# Patient Record
Sex: Male | Born: 1945 | Race: White | Hispanic: No | Marital: Married | Smoking: Former smoker
Health system: Southern US, Community
[De-identification: ages and names within clinical notes are randomized; demographics above are authoritative.]

## PROBLEM LIST (undated history)

## (undated) DIAGNOSIS — E119 Type 2 diabetes mellitus without complications: Secondary | ICD-10-CM

## (undated) DIAGNOSIS — R000 Tachycardia, unspecified: Secondary | ICD-10-CM

## (undated) DIAGNOSIS — G4730 Sleep apnea, unspecified: Secondary | ICD-10-CM

## (undated) DIAGNOSIS — E785 Hyperlipidemia, unspecified: Secondary | ICD-10-CM

## (undated) DIAGNOSIS — J449 Chronic obstructive pulmonary disease, unspecified: Secondary | ICD-10-CM

## (undated) DIAGNOSIS — M1990 Unspecified osteoarthritis, unspecified site: Secondary | ICD-10-CM

## (undated) HISTORY — DX: Hyperlipidemia, unspecified: E78.5

## (undated) HISTORY — PX: TONSILLECTOMY: SUR1361

## (undated) HISTORY — DX: Sleep apnea, unspecified: G47.30

## (undated) HISTORY — DX: Tachycardia, unspecified: R00.0

---

## 2000-08-23 ENCOUNTER — Ambulatory Visit (HOSPITAL_COMMUNITY): Admission: RE | Admit: 2000-08-23 | Discharge: 2000-08-23 | Payer: Self-pay | Admitting: *Deleted

## 2000-08-23 ENCOUNTER — Encounter: Payer: Self-pay | Admitting: *Deleted

## 2000-09-27 ENCOUNTER — Ambulatory Visit (HOSPITAL_COMMUNITY): Admission: RE | Admit: 2000-09-27 | Discharge: 2000-09-27 | Payer: Self-pay | Admitting: Neurosurgery

## 2000-09-27 ENCOUNTER — Encounter: Payer: Self-pay | Admitting: Neurosurgery

## 2000-10-01 ENCOUNTER — Emergency Department (HOSPITAL_COMMUNITY): Admission: EM | Admit: 2000-10-01 | Discharge: 2000-10-01 | Payer: Self-pay

## 2000-10-26 ENCOUNTER — Encounter: Payer: Self-pay | Admitting: Neurosurgery

## 2000-10-26 ENCOUNTER — Ambulatory Visit (HOSPITAL_COMMUNITY): Admission: RE | Admit: 2000-10-26 | Discharge: 2000-10-27 | Payer: Self-pay | Admitting: Neurosurgery

## 2006-04-10 ENCOUNTER — Emergency Department (HOSPITAL_COMMUNITY): Admission: EM | Admit: 2006-04-10 | Discharge: 2006-04-10 | Payer: Self-pay | Admitting: Emergency Medicine

## 2008-02-09 DIAGNOSIS — E119 Type 2 diabetes mellitus without complications: Secondary | ICD-10-CM

## 2008-02-09 DIAGNOSIS — E785 Hyperlipidemia, unspecified: Secondary | ICD-10-CM | POA: Insufficient documentation

## 2008-02-09 HISTORY — DX: Type 2 diabetes mellitus without complications: E11.9

## 2008-12-16 ENCOUNTER — Inpatient Hospital Stay (HOSPITAL_COMMUNITY): Admission: RE | Admit: 2008-12-16 | Discharge: 2008-12-18 | Payer: Self-pay | Admitting: Orthopedic Surgery

## 2010-05-13 LAB — URINALYSIS, ROUTINE W REFLEX MICROSCOPIC
Bilirubin Urine: NEGATIVE
Nitrite: NEGATIVE
Protein, ur: NEGATIVE mg/dL
Urobilinogen, UA: 0.2 mg/dL (ref 0.0–1.0)

## 2010-05-13 LAB — URINE CULTURE: Colony Count: NO GROWTH

## 2010-05-13 LAB — CBC
HCT: 31.9 % — ABNORMAL LOW (ref 39.0–52.0)
Hemoglobin: 11.2 g/dL — ABNORMAL LOW (ref 13.0–17.0)
Hemoglobin: 11.8 g/dL — ABNORMAL LOW (ref 13.0–17.0)
MCHC: 34.8 g/dL (ref 30.0–36.0)
MCHC: 35.1 g/dL (ref 30.0–36.0)
MCHC: 35.2 g/dL (ref 30.0–36.0)
Platelets: 129 10*3/uL — ABNORMAL LOW (ref 150–400)
RBC: 3.42 MIL/uL — ABNORMAL LOW (ref 4.22–5.81)
RBC: 3.62 MIL/uL — ABNORMAL LOW (ref 4.22–5.81)
RBC: 4.79 MIL/uL (ref 4.22–5.81)
WBC: 5 10*3/uL (ref 4.0–10.5)

## 2010-05-13 LAB — CROSSMATCH: Antibody Screen: NEGATIVE

## 2010-05-13 LAB — PROTIME-INR
INR: 0.96 (ref 0.00–1.49)
Prothrombin Time: 12.7 seconds (ref 11.6–15.2)

## 2010-05-13 LAB — GLUCOSE, CAPILLARY
Glucose-Capillary: 121 mg/dL — ABNORMAL HIGH (ref 70–99)
Glucose-Capillary: 132 mg/dL — ABNORMAL HIGH (ref 70–99)
Glucose-Capillary: 138 mg/dL — ABNORMAL HIGH (ref 70–99)
Glucose-Capillary: 141 mg/dL — ABNORMAL HIGH (ref 70–99)
Glucose-Capillary: 180 mg/dL — ABNORMAL HIGH (ref 70–99)

## 2010-05-13 LAB — COMPREHENSIVE METABOLIC PANEL
CO2: 29 mEq/L (ref 19–32)
Calcium: 9.6 mg/dL (ref 8.4–10.5)
Chloride: 102 mEq/L (ref 96–112)
Creatinine, Ser: 0.74 mg/dL (ref 0.4–1.5)
GFR calc Af Amer: >60 mL/min (ref >60)
Glucose, Bld: 133 mg/dL — ABNORMAL HIGH (ref 70–99)
Sodium: 137 mEq/L (ref 135–145)
Total Bilirubin: 0.7 mg/dL (ref 0.3–1.2)
Total Protein: 6.4 g/dL (ref 6.0–8.3)

## 2010-05-13 LAB — BASIC METABOLIC PANEL
BUN: 7 mg/dL (ref 6–23)
BUN: 8 mg/dL (ref 6–23)
CO2: 30 mEq/L (ref 19–32)
Calcium: 8.3 mg/dL — ABNORMAL LOW (ref 8.4–10.5)
Chloride: 101 mEq/L (ref 96–112)
Creatinine, Ser: 0.81 mg/dL (ref 0.4–1.5)
GFR calc Af Amer: >60 mL/min (ref >60)
GFR calc Af Amer: >60 mL/min (ref >60)
GFR calc non Af Amer: >60 mL/min (ref >60)
Sodium: 135 mEq/L (ref 135–145)

## 2010-05-13 LAB — DIFFERENTIAL
Eosinophils Relative: 1 % (ref 0–5)
Neutrophils Relative %: 57 % (ref 43–77)

## 2010-05-13 LAB — APTT: aPTT: 30 seconds (ref 24–37)

## 2010-05-13 LAB — HEMOGLOBIN A1C: Hgb A1c MFr Bld: 7.1 % — ABNORMAL HIGH (ref 4.6–6.1)

## 2010-06-26 NOTE — Op Note (Signed)
[place]  Patient:    Travis Newman, Travis Newman Visit Number: [id number] MRN: [id number]          Service Type: DSU Location: [id number] Attending Physician:  Donn Pierini Dictated by:   Julio Sicks, M.D. Proc. 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  OPERATION PERFORMED:  Right C7-T1 laminotomy and foraminotomy with microdiskectomy.  SURGEON:  Julio Sicks, M.D.  ASSISTANT:  Donalee Citrin, Montez Hageman.  ANESTHESIA:  General endotracheal.  INDICATIONS FOR PROCEDURE:  The patient is a 65 year old male with a history of neck and right upper extremity pain, paresthesias and weakness consistent with right-sided C8 radiculopathy which has failed conservative management. MRI scanning was inconclusive.  CT  myelography demonstrated evidence of a small right-sided C7-T1 disk herniation with compression of the right-sided C8 nerve root.  We discussed options available to the patient for treatment of this.  We discussed the possibility of undergoing a right-sided C7-T1 laminotomy and foraminotomy with microdiskectomy for hopeful improvement of her symptoms.  The patient is aware of the risks and benefits and wishes to proceed.  DESCRIPTION OF PROCEDURE:  The patient was taken to the operating room and placed on the operating table in supine position.  After an adequate level of anesthesia was achieved, the patient was positioned prone onto bolsters with his head fixed in neutral head position utilizing a [place]field pin headrest. The patients posterior cervical region was shaved and prepped sterilely.  A 10 blade was used to make a linear incision overlying the C7-T1 interspace. This was carried down sharply in the  midline.  A subperiosteal dissection was performed on the right side exposing the lamina and facet joints of C7 and T1.  A deep self-retaining retractor was placed.  Intraoperative x-ray was taken and the level was confirmed.  A laminotomy was then performed using high speed drill and Kerrison rongeurs to remove the inferior one third of the lamina of C7 and the medial edge of the C7-T1 facet joint and the superior one third of the T1 lamina.  The ligamentum flavum was then elevated and resected in piecemeal fashion using Kerrison rongeurs.  The underlying thecal sac and exiting C8 nerve root were identified.  The microscope was brought into the field and used for microdissection of the right-sided C8 nerve root and underlying disk herniation.  The epidural venous plexus was coagulated and cut.  Thecal sac and right C8 nerve root were probed using a micronerve hook. In the axilla of the C8 nerve root, there was an obvious mass.  The C8 nerve root was retracted superiorly.  A free fragment of disk  herniation was encountered and this was removed using micropituitaries.  At this point all elements of disk herniation appeared to be resected.  There was no evidence of any continued compression.  The wound was then copiously irrigated with antibiotic solution.  Gelfoam was placed topically for hemostasis which was found to be good.  Microscope and retraction system were removed.  Hemostasis of the muscle achieve with electrocautery.  The wound was then closed in layers with Vicryl sutures.  Steri-Strips and sterile dressings were applied. There were no apparent complications.  The patient tolerated the procedure well and he returns to recovery room postoperatively. Dictated by:   Julio Sicks, M.D. Attending Physician:  Donn Pierini DD:  10/26/00 TD:  10/26/00 Job: [id number]

## 2012-11-15 DIAGNOSIS — M6530 Trigger finger, unspecified finger: Secondary | ICD-10-CM | POA: Diagnosis not present

## 2014-03-21 DIAGNOSIS — M1712 Unilateral primary osteoarthritis, left knee: Secondary | ICD-10-CM | POA: Insufficient documentation

## 2014-03-21 DIAGNOSIS — M25562 Pain in left knee: Secondary | ICD-10-CM | POA: Insufficient documentation

## 2014-03-21 DIAGNOSIS — Z96651 Presence of right artificial knee joint: Secondary | ICD-10-CM

## 2014-03-21 HISTORY — DX: Presence of right artificial knee joint: Z96.651

## 2014-03-21 HISTORY — DX: Unilateral primary osteoarthritis, left knee: M17.12

## 2014-03-21 HISTORY — DX: Pain in left knee: M25.562

## 2014-04-16 DIAGNOSIS — R05 Cough: Secondary | ICD-10-CM | POA: Diagnosis not present

## 2014-04-16 DIAGNOSIS — J069 Acute upper respiratory infection, unspecified: Secondary | ICD-10-CM | POA: Diagnosis not present

## 2014-05-17 DIAGNOSIS — B028 Zoster with other complications: Secondary | ICD-10-CM | POA: Diagnosis not present

## 2014-05-17 DIAGNOSIS — Z6832 Body mass index (BMI) 32.0-32.9, adult: Secondary | ICD-10-CM | POA: Diagnosis not present

## 2014-09-30 DIAGNOSIS — M1712 Unilateral primary osteoarthritis, left knee: Secondary | ICD-10-CM | POA: Diagnosis not present

## 2015-01-23 DIAGNOSIS — Z9181 History of falling: Secondary | ICD-10-CM | POA: Diagnosis not present

## 2015-01-23 DIAGNOSIS — Z6832 Body mass index (BMI) 32.0-32.9, adult: Secondary | ICD-10-CM | POA: Diagnosis not present

## 2015-01-23 DIAGNOSIS — Z1389 Encounter for screening for other disorder: Secondary | ICD-10-CM | POA: Diagnosis not present

## 2015-01-23 DIAGNOSIS — J069 Acute upper respiratory infection, unspecified: Secondary | ICD-10-CM | POA: Diagnosis not present

## 2015-01-30 DIAGNOSIS — M1712 Unilateral primary osteoarthritis, left knee: Secondary | ICD-10-CM | POA: Diagnosis not present

## 2015-05-05 DIAGNOSIS — M1712 Unilateral primary osteoarthritis, left knee: Secondary | ICD-10-CM | POA: Diagnosis not present

## 2015-10-28 DIAGNOSIS — M1712 Unilateral primary osteoarthritis, left knee: Secondary | ICD-10-CM | POA: Diagnosis not present

## 2016-01-15 DIAGNOSIS — J029 Acute pharyngitis, unspecified: Secondary | ICD-10-CM | POA: Diagnosis not present

## 2016-01-15 DIAGNOSIS — K148 Other diseases of tongue: Secondary | ICD-10-CM | POA: Insufficient documentation

## 2016-01-15 DIAGNOSIS — Z880 Allergy status to penicillin: Secondary | ICD-10-CM | POA: Diagnosis not present

## 2016-01-15 DIAGNOSIS — Z794 Long term (current) use of insulin: Secondary | ICD-10-CM

## 2016-01-15 DIAGNOSIS — Z87891 Personal history of nicotine dependence: Secondary | ICD-10-CM | POA: Diagnosis not present

## 2016-01-15 DIAGNOSIS — E119 Type 2 diabetes mellitus without complications: Secondary | ICD-10-CM | POA: Diagnosis not present

## 2016-01-15 DIAGNOSIS — R221 Localized swelling, mass and lump, neck: Secondary | ICD-10-CM | POA: Diagnosis not present

## 2016-01-15 DIAGNOSIS — Z7982 Long term (current) use of aspirin: Secondary | ICD-10-CM | POA: Diagnosis not present

## 2016-01-15 DIAGNOSIS — R1310 Dysphagia, unspecified: Secondary | ICD-10-CM | POA: Diagnosis not present

## 2016-01-15 DIAGNOSIS — G4730 Sleep apnea, unspecified: Secondary | ICD-10-CM | POA: Diagnosis not present

## 2016-01-15 DIAGNOSIS — K134 Granuloma and granuloma-like lesions of oral mucosa: Secondary | ICD-10-CM | POA: Diagnosis not present

## 2016-01-15 HISTORY — DX: Long term (current) use of insulin: Z79.4

## 2016-01-15 HISTORY — DX: Type 2 diabetes mellitus without complications: E11.9

## 2016-01-15 HISTORY — DX: Other diseases of tongue: K14.8

## 2016-02-04 DIAGNOSIS — K148 Other diseases of tongue: Secondary | ICD-10-CM | POA: Diagnosis not present

## 2016-02-04 DIAGNOSIS — L980 Pyogenic granuloma: Secondary | ICD-10-CM | POA: Diagnosis not present

## 2016-02-04 DIAGNOSIS — Z87891 Personal history of nicotine dependence: Secondary | ICD-10-CM | POA: Diagnosis not present

## 2016-02-04 DIAGNOSIS — K149 Disease of tongue, unspecified: Secondary | ICD-10-CM | POA: Diagnosis not present

## 2016-02-04 DIAGNOSIS — E119 Type 2 diabetes mellitus without complications: Secondary | ICD-10-CM | POA: Diagnosis not present

## 2016-02-07 DIAGNOSIS — K148 Other diseases of tongue: Secondary | ICD-10-CM | POA: Insufficient documentation

## 2016-02-07 HISTORY — DX: Other diseases of tongue: K14.8

## 2016-05-06 DIAGNOSIS — M5412 Radiculopathy, cervical region: Secondary | ICD-10-CM | POA: Diagnosis not present

## 2016-05-10 DIAGNOSIS — Z0389 Encounter for observation for other suspected diseases and conditions ruled out: Secondary | ICD-10-CM | POA: Diagnosis not present

## 2016-05-10 DIAGNOSIS — H02819 Retained foreign body in unspecified eye, unspecified eyelid: Secondary | ICD-10-CM | POA: Diagnosis not present

## 2016-05-11 DIAGNOSIS — M542 Cervicalgia: Secondary | ICD-10-CM | POA: Diagnosis not present

## 2016-05-11 DIAGNOSIS — M5412 Radiculopathy, cervical region: Secondary | ICD-10-CM | POA: Diagnosis not present

## 2016-05-12 DIAGNOSIS — M542 Cervicalgia: Secondary | ICD-10-CM | POA: Diagnosis not present

## 2016-05-12 DIAGNOSIS — M50322 Other cervical disc degeneration at C5-C6 level: Secondary | ICD-10-CM | POA: Diagnosis not present

## 2016-05-12 DIAGNOSIS — M5031 Other cervical disc degeneration,  high cervical region: Secondary | ICD-10-CM | POA: Diagnosis not present

## 2016-05-12 DIAGNOSIS — M50321 Other cervical disc degeneration at C4-C5 level: Secondary | ICD-10-CM | POA: Diagnosis not present

## 2016-08-23 DIAGNOSIS — Z9181 History of falling: Secondary | ICD-10-CM | POA: Diagnosis not present

## 2016-08-23 DIAGNOSIS — L989 Disorder of the skin and subcutaneous tissue, unspecified: Secondary | ICD-10-CM | POA: Diagnosis not present

## 2016-08-23 DIAGNOSIS — E349 Endocrine disorder, unspecified: Secondary | ICD-10-CM | POA: Diagnosis not present

## 2016-08-23 DIAGNOSIS — Z1389 Encounter for screening for other disorder: Secondary | ICD-10-CM | POA: Diagnosis not present

## 2016-08-23 DIAGNOSIS — L820 Inflamed seborrheic keratosis: Secondary | ICD-10-CM | POA: Diagnosis not present

## 2016-08-23 DIAGNOSIS — L918 Other hypertrophic disorders of the skin: Secondary | ICD-10-CM | POA: Diagnosis not present

## 2016-09-03 ENCOUNTER — Other Ambulatory Visit: Payer: Self-pay

## 2016-09-30 DIAGNOSIS — C44119 Basal cell carcinoma of skin of left eyelid, including canthus: Secondary | ICD-10-CM | POA: Diagnosis not present

## 2016-09-30 DIAGNOSIS — L918 Other hypertrophic disorders of the skin: Secondary | ICD-10-CM | POA: Diagnosis not present

## 2016-09-30 DIAGNOSIS — L814 Other melanin hyperpigmentation: Secondary | ICD-10-CM | POA: Diagnosis not present

## 2016-11-06 DIAGNOSIS — C44119 Basal cell carcinoma of skin of left eyelid, including canthus: Secondary | ICD-10-CM | POA: Diagnosis not present

## 2017-01-26 DIAGNOSIS — Z6831 Body mass index (BMI) 31.0-31.9, adult: Secondary | ICD-10-CM | POA: Diagnosis not present

## 2017-01-26 DIAGNOSIS — L03039 Cellulitis of unspecified toe: Secondary | ICD-10-CM | POA: Diagnosis not present

## 2017-01-26 DIAGNOSIS — E119 Type 2 diabetes mellitus without complications: Secondary | ICD-10-CM | POA: Diagnosis not present

## 2017-02-08 DIAGNOSIS — C4491 Basal cell carcinoma of skin, unspecified: Secondary | ICD-10-CM

## 2017-02-08 HISTORY — DX: Basal cell carcinoma of skin, unspecified: C44.91

## 2017-02-17 DIAGNOSIS — G8929 Other chronic pain: Secondary | ICD-10-CM | POA: Diagnosis not present

## 2017-02-17 DIAGNOSIS — Z96651 Presence of right artificial knee joint: Secondary | ICD-10-CM | POA: Diagnosis not present

## 2017-02-17 DIAGNOSIS — M25562 Pain in left knee: Secondary | ICD-10-CM | POA: Diagnosis not present

## 2017-02-17 DIAGNOSIS — M1712 Unilateral primary osteoarthritis, left knee: Secondary | ICD-10-CM | POA: Diagnosis not present

## 2017-03-02 DIAGNOSIS — L03221 Cellulitis of neck: Secondary | ICD-10-CM | POA: Diagnosis not present

## 2017-03-02 DIAGNOSIS — L918 Other hypertrophic disorders of the skin: Secondary | ICD-10-CM | POA: Diagnosis not present

## 2017-06-20 DIAGNOSIS — M1712 Unilateral primary osteoarthritis, left knee: Secondary | ICD-10-CM | POA: Diagnosis not present

## 2017-07-07 DIAGNOSIS — Z1331 Encounter for screening for depression: Secondary | ICD-10-CM | POA: Diagnosis not present

## 2017-07-07 DIAGNOSIS — L918 Other hypertrophic disorders of the skin: Secondary | ICD-10-CM | POA: Diagnosis not present

## 2017-07-07 DIAGNOSIS — L820 Inflamed seborrheic keratosis: Secondary | ICD-10-CM | POA: Diagnosis not present

## 2017-07-20 DIAGNOSIS — J9811 Atelectasis: Secondary | ICD-10-CM | POA: Diagnosis not present

## 2017-07-20 DIAGNOSIS — Z01818 Encounter for other preprocedural examination: Secondary | ICD-10-CM | POA: Diagnosis not present

## 2017-07-20 DIAGNOSIS — Z79899 Other long term (current) drug therapy: Secondary | ICD-10-CM | POA: Diagnosis not present

## 2017-07-20 DIAGNOSIS — M79609 Pain in unspecified limb: Secondary | ICD-10-CM | POA: Diagnosis not present

## 2017-08-10 ENCOUNTER — Ambulatory Visit: Payer: Self-pay | Admitting: Cardiology

## 2017-08-16 DIAGNOSIS — E119 Type 2 diabetes mellitus without complications: Secondary | ICD-10-CM | POA: Diagnosis present

## 2017-08-16 DIAGNOSIS — Z471 Aftercare following joint replacement surgery: Secondary | ICD-10-CM | POA: Diagnosis not present

## 2017-08-16 DIAGNOSIS — R531 Weakness: Secondary | ICD-10-CM | POA: Diagnosis not present

## 2017-08-16 DIAGNOSIS — Z79891 Long term (current) use of opiate analgesic: Secondary | ICD-10-CM | POA: Diagnosis not present

## 2017-08-16 DIAGNOSIS — I2510 Atherosclerotic heart disease of native coronary artery without angina pectoris: Secondary | ICD-10-CM | POA: Diagnosis present

## 2017-08-16 DIAGNOSIS — I10 Essential (primary) hypertension: Secondary | ICD-10-CM | POA: Diagnosis present

## 2017-08-16 DIAGNOSIS — Z9989 Dependence on other enabling machines and devices: Secondary | ICD-10-CM | POA: Diagnosis not present

## 2017-08-16 DIAGNOSIS — E559 Vitamin D deficiency, unspecified: Secondary | ICD-10-CM | POA: Diagnosis present

## 2017-08-16 DIAGNOSIS — Z96652 Presence of left artificial knee joint: Secondary | ICD-10-CM | POA: Diagnosis not present

## 2017-08-16 DIAGNOSIS — Z79899 Other long term (current) drug therapy: Secondary | ICD-10-CM | POA: Diagnosis not present

## 2017-08-16 DIAGNOSIS — M1712 Unilateral primary osteoarthritis, left knee: Secondary | ICD-10-CM | POA: Diagnosis present

## 2017-08-16 DIAGNOSIS — Z7984 Long term (current) use of oral hypoglycemic drugs: Secondary | ICD-10-CM | POA: Diagnosis not present

## 2017-08-16 DIAGNOSIS — Z794 Long term (current) use of insulin: Secondary | ICD-10-CM | POA: Diagnosis not present

## 2017-08-16 DIAGNOSIS — K219 Gastro-esophageal reflux disease without esophagitis: Secondary | ICD-10-CM | POA: Diagnosis present

## 2017-08-16 DIAGNOSIS — G4733 Obstructive sleep apnea (adult) (pediatric): Secondary | ICD-10-CM | POA: Diagnosis present

## 2017-08-16 DIAGNOSIS — E7800 Pure hypercholesterolemia, unspecified: Secondary | ICD-10-CM | POA: Diagnosis present

## 2017-08-16 DIAGNOSIS — F17210 Nicotine dependence, cigarettes, uncomplicated: Secondary | ICD-10-CM | POA: Diagnosis not present

## 2017-08-16 HISTORY — PX: REPLACEMENT TOTAL KNEE: SUR1224

## 2017-08-19 DIAGNOSIS — I10 Essential (primary) hypertension: Secondary | ICD-10-CM | POA: Diagnosis not present

## 2017-08-19 DIAGNOSIS — Z9181 History of falling: Secondary | ICD-10-CM | POA: Diagnosis not present

## 2017-08-19 DIAGNOSIS — E559 Vitamin D deficiency, unspecified: Secondary | ICD-10-CM | POA: Diagnosis not present

## 2017-08-19 DIAGNOSIS — M5412 Radiculopathy, cervical region: Secondary | ICD-10-CM | POA: Diagnosis not present

## 2017-08-19 DIAGNOSIS — E119 Type 2 diabetes mellitus without complications: Secondary | ICD-10-CM | POA: Diagnosis not present

## 2017-08-19 DIAGNOSIS — Z7982 Long term (current) use of aspirin: Secondary | ICD-10-CM | POA: Diagnosis not present

## 2017-08-19 DIAGNOSIS — M1991 Primary osteoarthritis, unspecified site: Secondary | ICD-10-CM | POA: Diagnosis not present

## 2017-08-19 DIAGNOSIS — G4733 Obstructive sleep apnea (adult) (pediatric): Secondary | ICD-10-CM | POA: Diagnosis not present

## 2017-08-19 DIAGNOSIS — Z471 Aftercare following joint replacement surgery: Secondary | ICD-10-CM | POA: Diagnosis not present

## 2017-08-19 DIAGNOSIS — Z794 Long term (current) use of insulin: Secondary | ICD-10-CM | POA: Diagnosis not present

## 2017-08-19 DIAGNOSIS — Z96653 Presence of artificial knee joint, bilateral: Secondary | ICD-10-CM | POA: Diagnosis not present

## 2017-08-20 DIAGNOSIS — M1991 Primary osteoarthritis, unspecified site: Secondary | ICD-10-CM | POA: Diagnosis not present

## 2017-08-20 DIAGNOSIS — Z471 Aftercare following joint replacement surgery: Secondary | ICD-10-CM | POA: Diagnosis not present

## 2017-08-20 DIAGNOSIS — E119 Type 2 diabetes mellitus without complications: Secondary | ICD-10-CM | POA: Diagnosis not present

## 2017-08-20 DIAGNOSIS — G4733 Obstructive sleep apnea (adult) (pediatric): Secondary | ICD-10-CM | POA: Diagnosis not present

## 2017-08-20 DIAGNOSIS — M5412 Radiculopathy, cervical region: Secondary | ICD-10-CM | POA: Diagnosis not present

## 2017-08-20 DIAGNOSIS — I10 Essential (primary) hypertension: Secondary | ICD-10-CM | POA: Diagnosis not present

## 2017-08-22 DIAGNOSIS — Z471 Aftercare following joint replacement surgery: Secondary | ICD-10-CM | POA: Diagnosis not present

## 2017-08-22 DIAGNOSIS — G4733 Obstructive sleep apnea (adult) (pediatric): Secondary | ICD-10-CM | POA: Diagnosis not present

## 2017-08-22 DIAGNOSIS — M5412 Radiculopathy, cervical region: Secondary | ICD-10-CM | POA: Diagnosis not present

## 2017-08-22 DIAGNOSIS — I10 Essential (primary) hypertension: Secondary | ICD-10-CM | POA: Diagnosis not present

## 2017-08-22 DIAGNOSIS — M1991 Primary osteoarthritis, unspecified site: Secondary | ICD-10-CM | POA: Diagnosis not present

## 2017-08-22 DIAGNOSIS — E119 Type 2 diabetes mellitus without complications: Secondary | ICD-10-CM | POA: Diagnosis not present

## 2017-08-23 ENCOUNTER — Emergency Department (HOSPITAL_COMMUNITY)
Admission: EM | Admit: 2017-08-23 | Discharge: 2017-08-23 | Disposition: A | Discharge status: Home / Self Care | Payer: Medicare Other | Attending: Emergency Medicine | Admitting: Emergency Medicine

## 2017-08-23 ENCOUNTER — Emergency Department (HOSPITAL_COMMUNITY): Payer: Medicare Other

## 2017-08-23 ENCOUNTER — Encounter (HOSPITAL_COMMUNITY): Payer: Self-pay

## 2017-08-23 ENCOUNTER — Other Ambulatory Visit: Payer: Self-pay

## 2017-08-23 DIAGNOSIS — R0789 Other chest pain: Secondary | ICD-10-CM | POA: Diagnosis not present

## 2017-08-23 DIAGNOSIS — R0602 Shortness of breath: Secondary | ICD-10-CM | POA: Diagnosis not present

## 2017-08-23 DIAGNOSIS — G4733 Obstructive sleep apnea (adult) (pediatric): Secondary | ICD-10-CM | POA: Diagnosis not present

## 2017-08-23 DIAGNOSIS — E119 Type 2 diabetes mellitus without complications: Secondary | ICD-10-CM | POA: Insufficient documentation

## 2017-08-23 DIAGNOSIS — Z471 Aftercare following joint replacement surgery: Secondary | ICD-10-CM | POA: Diagnosis not present

## 2017-08-23 DIAGNOSIS — R002 Palpitations: Secondary | ICD-10-CM | POA: Insufficient documentation

## 2017-08-23 DIAGNOSIS — J449 Chronic obstructive pulmonary disease, unspecified: Secondary | ICD-10-CM | POA: Insufficient documentation

## 2017-08-23 DIAGNOSIS — R0902 Hypoxemia: Secondary | ICD-10-CM | POA: Diagnosis not present

## 2017-08-23 DIAGNOSIS — R42 Dizziness and giddiness: Secondary | ICD-10-CM | POA: Insufficient documentation

## 2017-08-23 DIAGNOSIS — R079 Chest pain, unspecified: Secondary | ICD-10-CM | POA: Diagnosis not present

## 2017-08-23 DIAGNOSIS — M1991 Primary osteoarthritis, unspecified site: Secondary | ICD-10-CM | POA: Diagnosis not present

## 2017-08-23 DIAGNOSIS — M5412 Radiculopathy, cervical region: Secondary | ICD-10-CM | POA: Diagnosis not present

## 2017-08-23 DIAGNOSIS — I491 Atrial premature depolarization: Secondary | ICD-10-CM | POA: Diagnosis not present

## 2017-08-23 DIAGNOSIS — R197 Diarrhea, unspecified: Secondary | ICD-10-CM | POA: Diagnosis not present

## 2017-08-23 DIAGNOSIS — I10 Essential (primary) hypertension: Secondary | ICD-10-CM | POA: Diagnosis not present

## 2017-08-23 HISTORY — DX: Chronic obstructive pulmonary disease, unspecified: J44.9

## 2017-08-23 HISTORY — DX: Unspecified osteoarthritis, unspecified site: M19.90

## 2017-08-23 HISTORY — DX: Type 2 diabetes mellitus without complications: E11.9

## 2017-08-23 LAB — CBC
HCT: 35.7 % — ABNORMAL LOW (ref 39.0–52.0)
Hemoglobin: 11.7 g/dL — ABNORMAL LOW (ref 13.0–17.0)
MCH: 30.4 pg (ref 26.0–34.0)
MCHC: 32.8 g/dL (ref 30.0–36.0)
MCV: 92.7 fL (ref 78.0–100.0)
PLATELETS: 205 10*3/uL (ref 150–400)
RBC: 3.85 MIL/uL — ABNORMAL LOW (ref 4.22–5.81)
RDW: 13.2 % (ref 11.5–15.5)
WBC: 5.6 10*3/uL (ref 4.0–10.5)

## 2017-08-23 LAB — BASIC METABOLIC PANEL
Anion gap: 11 (ref 5–15)
BUN: 19 mg/dL (ref 8–23)
CALCIUM: 8.9 mg/dL (ref 8.9–10.3)
CO2: 23 mmol/L (ref 22–32)
CREATININE: 0.92 mg/dL (ref 0.61–1.24)
Chloride: 104 mmol/L (ref 98–111)
GFR calc Af Amer: >60 mL/min (ref >60)
GFR calc non Af Amer: >60 mL/min (ref >60)
GLUCOSE: 153 mg/dL — AB (ref 70–99)
POTASSIUM: 4 mmol/L (ref 3.5–5.1)
SODIUM: 138 mmol/L (ref 135–145)

## 2017-08-23 LAB — D-DIMER, QUANTITATIVE (NOT AT ARMC): D DIMER QUANT: 2.38 ug{FEU}/mL — AB (ref 0.00–0.50)

## 2017-08-23 LAB — TROPONIN I: Troponin I: <0.03 ng/mL (ref <0.03)

## 2017-08-23 LAB — I-STAT TROPONIN, ED: TROPONIN I, POC: 0 ng/mL (ref 0.00–0.08)

## 2017-08-23 MED ORDER — INSULIN ASPART 100 UNIT/ML ~~LOC~~ SOLN: 18.0000 [IU] | Freq: Once | SUBCUTANEOUS | Status: DC

## 2017-08-23 MED ORDER — IOPAMIDOL (ISOVUE-370) INJECTION 76%
100.0000 mL | Freq: Once | INTRAVENOUS | Status: AC | PRN
  Administered 2017-08-23: 100 mL via INTRAVENOUS

## 2017-08-23 MED ORDER — IOPAMIDOL (ISOVUE-370) INJECTION 76%
INTRAVENOUS | Status: AC
  Filled 2017-08-23: qty 100

## 2017-08-23 NOTE — ED Notes (Addendum)
Dr. Alfonse Spruce aware of 2.38 D Dimer

## 2017-08-23 NOTE — ED Provider Notes (Signed)
[place] EMERGENCY DEPARTMENT Provider Note   CSN: [id number] Arrival date & time: 08/23/17  1207     History   Chief Complaint Chief Complaint  Patient presents with  . Chest Pain    HPI Travis Newman is a 72 y.o. male with a history of OSA on CPAP and DM who presents with a 9 hour history of palpitations and lightheadedness.  Patient reports that he woke up this morning at 4 AM and had left knee pain from recent knee surgery 1 week ago. He states that he took oxycodone and several minutes later began to have a "fluttery feeling in his chest". He also began to feel light-headedness but denies dizziness and LOC. He attended his physical therapy and continued to have palpitations and lightheadedness. Due to his continued symptoms, his providers recommended that he be seen in the ED.  He had 3-4 episodes of diarrhea yesterday but has had a normal bowel movement today. He denies fever, vomiting, abdominal pain.  He is also taking Aspirin 325 mg and uses compression stockings for clot prevention. He denies dizziness, chest pain, SOB. He denies history of PE/DVT, leg swelling, recent travel, and hormone therapy.   Past Medical History:  Diagnosis Date  . Arthritis   . COPD (chronic obstructive pulmonary disease) ([place])   . Diabetes mellitus without complication ([place])     Home Medications    Prior to Admission medications   Not on File    Family History No family history on file.  Social History Social History   Tobacco Use  . Smoking status: Never Smoker  . Smokeless tobacco: Never Used  Substance Use Topics  . Alcohol use: Never    Frequency: Never  . Drug use: Never     Allergies   Penicillins; Lidocaine-epinephrine; and Clindamycin hcl   Review of Systems Review of Systems  Constitutional: Negative for activity change, appetite change, chills and fever.  HENT: Negative for congestion and sore throat.   Respiratory: Negative for  cough, chest tightness and shortness of breath.   Cardiovascular: Positive for palpitations. Negative for chest pain.  Gastrointestinal: Positive for diarrhea. Negative for abdominal pain, constipation, nausea and vomiting.  Genitourinary: Negative for frequency, hematuria and urgency.  Skin: Negative for rash.  Neurological: Positive for light-headedness. Negative for dizziness, weakness and numbness.  All other systems reviewed and are negative.   Physical Exam Updated Vital Signs BP 130/80   Pulse 85   Temp 98.3 F (36.8 C)   Resp 18   Ht 6\' 2"  (1.88 m)   Wt 112.9 kg (249 lb)   SpO2 99%   BMI 31.97 kg/m   Physical Exam  Constitutional: He is oriented to person, place, and time. He appears well-developed and well-nourished.  HENT:  Head: Normocephalic and atraumatic.  Eyes: Pupils are equal, round, and reactive to light. EOM are normal.  Neck: Normal range of motion.  Cardiovascular: Regular rhythm, intact distal pulses and normal pulses.  Pulmonary/Chest: Effort normal and breath sounds normal.  Abdominal: Soft. Bowel sounds are normal.  Neurological: He is alert and oriented to person, place, and time.  Skin: Skin is warm and dry.  Psychiatric: He has a normal mood and affect. His behavior is normal.     ED Treatments / Results  Labs (all labs ordered are listed, but only abnormal results are displayed) Labs Reviewed  BASIC METABOLIC PANEL - Abnormal; Notable for the following components:      Result Value  Glucose, Bld 153 (*)    All other components within normal limits  CBC - Abnormal; Notable for the following components:   RBC 3.85 (*)    Hemoglobin 11.7 (*)    HCT 35.7 (*)    All other components within normal limits  D-DIMER, QUANTITATIVE (NOT AT [facility]) - Abnormal; Notable for the following components:   D-Dimer, Quant 2.38 (*)    All other components within normal limits  I-STAT TROPONIN, ED    EKG EKG Interpretation  Date/Time:  Tuesday August 23 2017 12:08:03 EDT Ventricular Rate:  80 PR Interval:    QRS Duration: 100 QT Interval:  406 QTC Calculation: 469 R Axis:   -54 Text Interpretation:  Sinus rhythm Paired ventricular premature complexes Left anterior fascicular block Abnormal R-wave progression, early transition PVC's Confirmed by Merrily Pew [phone]) on 08/23/2017 1:58:12 PM   Radiology Dg Chest 2 View  Result Date: 08/23/2017 CLINICAL DATA:  Chest pain. EXAM: CHEST - 2 VIEW COMPARISON:  07/20/2017. FINDINGS: Mediastinum hilar structures normal. Cardiomegaly with normal pulmonary vascularity. Low lung volumes with mild bibasilar atelectasis/scarring unchanged from prior exam. Stable elevation right hemidiaphragm. No pleural effusion or pneumothorax. IMPRESSION: 1. Low lung volumes with mild bibasilar atelectasis/scarring. Elevation right hemidiaphragm. These findings are stable from prior exam. 2.  Cardiomegaly.  No pulmonary venous congestion. Electronically Signed   By: Marcello Moores  Register   On: 08/23/2017 14:27    Procedures Procedures (including critical care time)  Medications Ordered in ED Medications - No data to display   Initial Impression / Assessment and Plan / ED Course  I have reviewed the triage vital signs and the nursing notes.  Pertinent labs & imaging results that were available during my care of the patient were reviewed by me and considered in my medical decision making (see chart for details).  Clinical Course as of Aug 24 1530  Tue Aug 23, 2017  1246 I-stat troponin, ED [JP]  1439 D-Dimer, Quant(!): 2.38 [JP]    Clinical Course User Index [JP] Carroll Sage, MD   72 y.o. male with a history of OSA on CPAP and DM who presents with a 9 hour history of palpitations and lightheadedness. Differential diagnosis includes pre-ventricular contractions secondary to ACS and PE.  #Palpitations - Patient was found to have an EKG showing sinus rhythm with PVC's; left anterior fascicular block. No acute  ischemic changes. - D-dimer: 2.38; CT angio: negative for pulmonary embolism. - Troponin: negative x 2 - BMP: Glucose 153; CBC unremarkable - Recommended following up with PCP in 1 week. Also recommended following up with a cardiologist for further evaluation of pre-ventricular contractions.  Final Clinical Impressions(s) / ED Diagnoses   Final diagnoses:  Palpitation  Light headedness    ED Discharge Orders    None       Carroll Sage, MD 08/23/17 2141    Merrily Pew, MD 08/23/17 2302

## 2017-08-23 NOTE — ED Notes (Signed)
ED Provider at bedside. 

## 2017-08-23 NOTE — ED Notes (Signed)
RN walked in and found pt eating his dinner tray. Blood sugar level was 112, no insulin given.

## 2017-08-23 NOTE — Discharge Instructions (Signed)
Please follow up with a cardiologist of your choosing in the next 2 weeks for further cardiac workup.

## 2017-08-23 NOTE — ED Triage Notes (Signed)
Pt c/o chest pressure, dizziness, and palpitations that began around 0330 this morning ; pt  was at therapy for his left knee replacement that he had 08/16/17 and states that the palpitations got worse and also began to feel nauseated ; EMS  States ekg shows various PVC's ; pt denies any further chest pressure at this time but still states he feels some palpitations at this time; pt received 324 of ASA by EMS

## 2017-08-23 NOTE — ED Notes (Signed)
RN called lab to inquire as to the trop, still have 20 minutes on the machine.  RN updated family.

## 2017-08-24 DIAGNOSIS — G4733 Obstructive sleep apnea (adult) (pediatric): Secondary | ICD-10-CM | POA: Diagnosis not present

## 2017-08-24 DIAGNOSIS — M1991 Primary osteoarthritis, unspecified site: Secondary | ICD-10-CM | POA: Diagnosis not present

## 2017-08-24 DIAGNOSIS — E119 Type 2 diabetes mellitus without complications: Secondary | ICD-10-CM | POA: Diagnosis not present

## 2017-08-24 DIAGNOSIS — M5412 Radiculopathy, cervical region: Secondary | ICD-10-CM | POA: Diagnosis not present

## 2017-08-24 DIAGNOSIS — Z471 Aftercare following joint replacement surgery: Secondary | ICD-10-CM | POA: Diagnosis not present

## 2017-08-24 DIAGNOSIS — I10 Essential (primary) hypertension: Secondary | ICD-10-CM | POA: Diagnosis not present

## 2017-08-25 DIAGNOSIS — G4733 Obstructive sleep apnea (adult) (pediatric): Secondary | ICD-10-CM | POA: Diagnosis not present

## 2017-08-25 DIAGNOSIS — M1991 Primary osteoarthritis, unspecified site: Secondary | ICD-10-CM | POA: Diagnosis not present

## 2017-08-25 DIAGNOSIS — Z471 Aftercare following joint replacement surgery: Secondary | ICD-10-CM | POA: Diagnosis not present

## 2017-08-25 DIAGNOSIS — I10 Essential (primary) hypertension: Secondary | ICD-10-CM | POA: Diagnosis not present

## 2017-08-25 DIAGNOSIS — E119 Type 2 diabetes mellitus without complications: Secondary | ICD-10-CM | POA: Diagnosis not present

## 2017-08-25 DIAGNOSIS — M5412 Radiculopathy, cervical region: Secondary | ICD-10-CM | POA: Diagnosis not present

## 2017-08-26 DIAGNOSIS — I10 Essential (primary) hypertension: Secondary | ICD-10-CM | POA: Diagnosis not present

## 2017-08-26 DIAGNOSIS — M5412 Radiculopathy, cervical region: Secondary | ICD-10-CM | POA: Diagnosis not present

## 2017-08-26 DIAGNOSIS — Z471 Aftercare following joint replacement surgery: Secondary | ICD-10-CM | POA: Diagnosis not present

## 2017-08-26 DIAGNOSIS — G4733 Obstructive sleep apnea (adult) (pediatric): Secondary | ICD-10-CM | POA: Diagnosis not present

## 2017-08-26 DIAGNOSIS — E119 Type 2 diabetes mellitus without complications: Secondary | ICD-10-CM | POA: Diagnosis not present

## 2017-08-26 DIAGNOSIS — M1991 Primary osteoarthritis, unspecified site: Secondary | ICD-10-CM | POA: Diagnosis not present

## 2017-08-29 DIAGNOSIS — M5412 Radiculopathy, cervical region: Secondary | ICD-10-CM | POA: Diagnosis not present

## 2017-08-29 DIAGNOSIS — M1991 Primary osteoarthritis, unspecified site: Secondary | ICD-10-CM | POA: Diagnosis not present

## 2017-08-29 DIAGNOSIS — Z471 Aftercare following joint replacement surgery: Secondary | ICD-10-CM | POA: Diagnosis not present

## 2017-08-29 DIAGNOSIS — G4733 Obstructive sleep apnea (adult) (pediatric): Secondary | ICD-10-CM | POA: Diagnosis not present

## 2017-08-29 DIAGNOSIS — E119 Type 2 diabetes mellitus without complications: Secondary | ICD-10-CM | POA: Diagnosis not present

## 2017-08-29 DIAGNOSIS — I10 Essential (primary) hypertension: Secondary | ICD-10-CM | POA: Diagnosis not present

## 2017-08-30 DIAGNOSIS — R2689 Other abnormalities of gait and mobility: Secondary | ICD-10-CM | POA: Diagnosis not present

## 2017-08-30 DIAGNOSIS — M6289 Other specified disorders of muscle: Secondary | ICD-10-CM | POA: Diagnosis not present

## 2017-08-30 DIAGNOSIS — M1712 Unilateral primary osteoarthritis, left knee: Secondary | ICD-10-CM | POA: Diagnosis not present

## 2017-09-02 DIAGNOSIS — M6289 Other specified disorders of muscle: Secondary | ICD-10-CM | POA: Diagnosis not present

## 2017-09-02 DIAGNOSIS — R2689 Other abnormalities of gait and mobility: Secondary | ICD-10-CM | POA: Diagnosis not present

## 2017-09-02 DIAGNOSIS — M1712 Unilateral primary osteoarthritis, left knee: Secondary | ICD-10-CM | POA: Diagnosis not present

## 2017-09-06 DIAGNOSIS — M1712 Unilateral primary osteoarthritis, left knee: Secondary | ICD-10-CM | POA: Diagnosis not present

## 2017-09-06 DIAGNOSIS — R2689 Other abnormalities of gait and mobility: Secondary | ICD-10-CM | POA: Diagnosis not present

## 2017-09-06 DIAGNOSIS — M6289 Other specified disorders of muscle: Secondary | ICD-10-CM | POA: Diagnosis not present

## 2017-09-09 DIAGNOSIS — R2689 Other abnormalities of gait and mobility: Secondary | ICD-10-CM | POA: Diagnosis not present

## 2017-09-09 DIAGNOSIS — M6289 Other specified disorders of muscle: Secondary | ICD-10-CM | POA: Diagnosis not present

## 2017-09-09 DIAGNOSIS — M1712 Unilateral primary osteoarthritis, left knee: Secondary | ICD-10-CM | POA: Diagnosis not present

## 2017-09-13 DIAGNOSIS — M1712 Unilateral primary osteoarthritis, left knee: Secondary | ICD-10-CM | POA: Diagnosis not present

## 2017-09-13 DIAGNOSIS — M6289 Other specified disorders of muscle: Secondary | ICD-10-CM | POA: Diagnosis not present

## 2017-09-13 DIAGNOSIS — R2689 Other abnormalities of gait and mobility: Secondary | ICD-10-CM | POA: Diagnosis not present

## 2017-09-16 DIAGNOSIS — R2689 Other abnormalities of gait and mobility: Secondary | ICD-10-CM | POA: Diagnosis not present

## 2017-09-16 DIAGNOSIS — M6289 Other specified disorders of muscle: Secondary | ICD-10-CM | POA: Diagnosis not present

## 2017-09-16 DIAGNOSIS — M1712 Unilateral primary osteoarthritis, left knee: Secondary | ICD-10-CM | POA: Diagnosis not present

## 2017-09-20 DIAGNOSIS — R2689 Other abnormalities of gait and mobility: Secondary | ICD-10-CM | POA: Diagnosis not present

## 2017-09-20 DIAGNOSIS — M6289 Other specified disorders of muscle: Secondary | ICD-10-CM | POA: Diagnosis not present

## 2017-09-20 DIAGNOSIS — M1712 Unilateral primary osteoarthritis, left knee: Secondary | ICD-10-CM | POA: Diagnosis not present

## 2017-09-23 DIAGNOSIS — R2689 Other abnormalities of gait and mobility: Secondary | ICD-10-CM | POA: Diagnosis not present

## 2017-09-23 DIAGNOSIS — M1712 Unilateral primary osteoarthritis, left knee: Secondary | ICD-10-CM | POA: Diagnosis not present

## 2017-09-23 DIAGNOSIS — M6281 Muscle weakness (generalized): Secondary | ICD-10-CM | POA: Diagnosis not present

## 2017-09-23 DIAGNOSIS — M25562 Pain in left knee: Secondary | ICD-10-CM | POA: Diagnosis not present

## 2017-09-26 DIAGNOSIS — M1712 Unilateral primary osteoarthritis, left knee: Secondary | ICD-10-CM | POA: Diagnosis not present

## 2017-11-07 DIAGNOSIS — M1712 Unilateral primary osteoarthritis, left knee: Secondary | ICD-10-CM | POA: Diagnosis not present

## 2017-11-07 DIAGNOSIS — Z96652 Presence of left artificial knee joint: Secondary | ICD-10-CM | POA: Diagnosis not present

## 2017-12-19 DIAGNOSIS — J189 Pneumonia, unspecified organism: Secondary | ICD-10-CM | POA: Diagnosis not present

## 2017-12-30 DIAGNOSIS — J189 Pneumonia, unspecified organism: Secondary | ICD-10-CM | POA: Diagnosis not present

## 2017-12-30 DIAGNOSIS — Z9181 History of falling: Secondary | ICD-10-CM | POA: Diagnosis not present

## 2017-12-30 DIAGNOSIS — Z1339 Encounter for screening examination for other mental health and behavioral disorders: Secondary | ICD-10-CM | POA: Diagnosis not present

## 2017-12-30 DIAGNOSIS — Z23 Encounter for immunization: Secondary | ICD-10-CM | POA: Diagnosis not present

## 2017-12-30 DIAGNOSIS — E119 Type 2 diabetes mellitus without complications: Secondary | ICD-10-CM | POA: Diagnosis not present

## 2018-02-07 DIAGNOSIS — Z96652 Presence of left artificial knee joint: Secondary | ICD-10-CM | POA: Diagnosis not present

## 2018-02-13 DIAGNOSIS — L820 Inflamed seborrheic keratosis: Secondary | ICD-10-CM | POA: Diagnosis not present

## 2018-05-16 DIAGNOSIS — N390 Urinary tract infection, site not specified: Secondary | ICD-10-CM | POA: Diagnosis not present

## 2018-05-16 DIAGNOSIS — Z6832 Body mass index (BMI) 32.0-32.9, adult: Secondary | ICD-10-CM | POA: Diagnosis not present

## 2018-09-27 DIAGNOSIS — Z139 Encounter for screening, unspecified: Secondary | ICD-10-CM | POA: Diagnosis not present

## 2018-09-27 DIAGNOSIS — Z9181 History of falling: Secondary | ICD-10-CM | POA: Diagnosis not present

## 2018-09-27 DIAGNOSIS — Z1331 Encounter for screening for depression: Secondary | ICD-10-CM | POA: Diagnosis not present

## 2018-09-27 DIAGNOSIS — L820 Inflamed seborrheic keratosis: Secondary | ICD-10-CM | POA: Diagnosis not present

## 2018-10-09 DIAGNOSIS — Z96652 Presence of left artificial knee joint: Secondary | ICD-10-CM | POA: Diagnosis not present

## 2018-11-10 DIAGNOSIS — R05 Cough: Secondary | ICD-10-CM | POA: Diagnosis not present

## 2018-11-10 DIAGNOSIS — Z20828 Contact with and (suspected) exposure to other viral communicable diseases: Secondary | ICD-10-CM | POA: Diagnosis not present

## 2018-11-21 DIAGNOSIS — U071 COVID-19: Secondary | ICD-10-CM | POA: Diagnosis not present

## 2018-11-21 DIAGNOSIS — R05 Cough: Secondary | ICD-10-CM | POA: Diagnosis not present

## 2018-12-21 DIAGNOSIS — Z6832 Body mass index (BMI) 32.0-32.9, adult: Secondary | ICD-10-CM | POA: Diagnosis not present

## 2018-12-21 DIAGNOSIS — L819 Disorder of pigmentation, unspecified: Secondary | ICD-10-CM | POA: Diagnosis not present

## 2018-12-21 DIAGNOSIS — L820 Inflamed seborrheic keratosis: Secondary | ICD-10-CM | POA: Diagnosis not present

## 2019-02-12 DIAGNOSIS — L821 Other seborrheic keratosis: Secondary | ICD-10-CM | POA: Diagnosis not present

## 2019-02-12 DIAGNOSIS — L578 Other skin changes due to chronic exposure to nonionizing radiation: Secondary | ICD-10-CM | POA: Diagnosis not present

## 2019-02-12 DIAGNOSIS — L570 Actinic keratosis: Secondary | ICD-10-CM | POA: Diagnosis not present

## 2019-02-12 DIAGNOSIS — D1801 Hemangioma of skin and subcutaneous tissue: Secondary | ICD-10-CM | POA: Diagnosis not present

## 2019-02-12 DIAGNOSIS — L820 Inflamed seborrheic keratosis: Secondary | ICD-10-CM | POA: Diagnosis not present

## 2019-02-12 DIAGNOSIS — C4441 Basal cell carcinoma of skin of scalp and neck: Secondary | ICD-10-CM | POA: Diagnosis not present

## 2019-02-22 DIAGNOSIS — C4441 Basal cell carcinoma of skin of scalp and neck: Secondary | ICD-10-CM | POA: Diagnosis not present

## 2019-03-08 NOTE — Progress Notes (Signed)
Cardiology Office Note:    Date:  03/09/2019   ID:  Travis Newman, DOB May 21, 1945, MRN [id number]  PCP:  Delorise Shiner, MD  Cardiologist:  Shirlee More, MD   Referring MD: Delorise Shiner, MD  ASSESSMENT:    1. Leg swelling   2. Coronary artery calcification seen on CT scan   3. Type 2 diabetes mellitus without complication, with long-term current use of insulin ([place])   4. Left anterior fascicular hemiblock   5. History of COVID-19   6. Dyspnea on exertion    PLAN:    In order of problems listed above:  1. This may all be due to medications alpha-blocker gabapentin but may also be a reflection of heart failure I asked him to have labs performed including a proBNP level echocardiogram to define if he is demonstrable heart failure and to for the time being to take his low gabapentin as possible.  If EF is reduced he will need an ischemia evaluation 2. Coronary calcification on CT scan see above if EF is reduced will need an ischemia evaluation 3. Stable continue current treatment 4. Stable EKG pattern 5. Check chest x-ray he may have residual shortness of breath and cough from his COVID-19 infection  Next appointment seen in the office in 6 weeks after this testing is done.  If his proBNP level significantly elevated I will place him on a loop diuretic.   Medication Adjustments/Labs and Tests Ordered: Current medicines are reviewed at length with the patient today.  Concerns regarding medicines are outlined above.  Orders Placed This Encounter  Procedures  . DG Chest 2 View  . Pro b natriuretic peptide (BNP)[facility])  . EKG 12-Lead  . ECHOCARDIOGRAM COMPLETE   No orders of the defined types were placed in this encounter.    Chief Complaint  Patient presents with  . New Patient (Initial Visit)    leg swelling    History of Present Illness:    Travis Newman is a 74 y.o. male who is being seen today for the evaluation of edema shortness of  breath at the request of Delorise Shiner, MD.  More than 5 to 8 years ago he saw one of my partners for recurrent SVT.  He is unaware if he had any cardiology evaluation other than ED visits.  He had COVID-19 11/09/2019 was sick for about 8 days managed as an outpatient he wore CPAP at night and did not require supplemental oxygen did not have a chest x-ray.  He takes 2 drugs that tend to cause edema Cardura for BPH gabapentin for pain and since then has had peripheral edema and is worse in the lower extremities he has noticed subtle shortness of breath that is worsened with physical activity walking on an incline stairs and lifting and carrying but he has no orthopnea chest pain palpitation or syncope.  I recently cared for his wife who had atrial arrhythmia and he is concerned he may have congestive heart failure.  He has no history of congenital rheumatic heart disease he does have obstructive sleep apnea.  He was unaware he had coronary artery calcification.  He has no known history of CAD.  He had phoned the [facility] hospital was unable to make a cardiology consultation appointment.  CTA chest 08/24/2018: IMPRESSION: Negative for pulmonary embolism.  No acute abnormality in the chest Small nodules along the fissures most consistent with benign lymph nodes. Mild coronary artery calcification Aortic Atherosclerosis   Ref Range &  Units 1 yr ago  Troponin I <0.03 ng/mL <0.03     Past Medical History:  Diagnosis Date  . Arthritis   . COPD (chronic obstructive pulmonary disease) ([place])   . Diabetes mellitus without complication ([place])   . Tachycardia     Past Surgical History:  Procedure Laterality Date  . KNEE SURGERY Right   . 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 ROTATOR CUFF REPAIR Right   . TONSILLECTOMY      Current Medications: Current Meds  Medication Sig  . doxazosin (CARDURA) 8 MG tablet Take 8 mg by mouth at bedtime.  Marland Kitchen ezetimibe (ZETIA) 10 MG tablet Take 10 mg by mouth daily.    . finasteride (PROSCAR) 5 MG tablet Take 5 mg by mouth daily.  Marland Kitchen gabapentin (NEURONTIN) 300 MG capsule Take 300 mg by mouth 3 (three) times daily.  . insulin aspart (NOVOLOG) 100 UNIT/ML injection Inject 18 Units into the skin 3 (three) times daily before meals.  . metFORMIN (GLUCOPHAGE) 500 MG tablet Take 500 mg by mouth 2 (two) times daily with a meal.  . metoprolol tartrate (LOPRESSOR) 25 MG tablet Take 25 mg by mouth daily.  Marland Kitchen omeprazole (PRILOSEC) 20 MG capsule Take 20 mg by mouth daily as needed. Acid reflux  . Semaglutide (OZEMPIC, 1 MG/DOSE, [place]) Inject into the skin once a week.  . zolpidem (AMBIEN) 10 MG tablet Take 10 mg by mouth at bedtime as needed for sleep.     Allergies:   Penicillins, Lidocaine-epinephrine, and Clindamycin hcl   Social History   Socioeconomic History  . Marital status: Married    Spouse name: Not on file  . Number of children: Not on file  . Years of education: Not on file  . Highest education level: Not on file  Occupational History  . Not on file  Tobacco Use  . Smoking status: Current Some Day Smoker    Types: Cigarettes  . Smokeless tobacco: Former Network engineer and Sexual Activity  . Alcohol use: Never  . Drug use: Never  . Sexual activity: Not Currently  Other Topics Concern  . Not on file  Social History Narrative  . Not on file   Social Determinants of Health   Financial Resource Strain:   . Difficulty of Paying Living Expenses: Not on file  Food Insecurity:   . Worried About Charity fundraiser in the Last Year: Not on file  . Ran Out of Food in the Last Year: Not on file  Transportation Needs:   . Lack of Transportation (Medical): Not on file  . Lack of Transportation (Non-Medical): Not on file  Physical Activity:   . Days of Exercise per Week: Not on file  . Minutes of Exercise per Session: Not on file  Stress:   . Feeling of Stress : Not on file  Social Connections:   . Frequency of Communication with Friends and  Family: Not on file  . Frequency of Social Gatherings with Friends and Family: Not on file  . Attends Religious Services: Not on file  . Active Member of Clubs or Organizations: Not on file  . Attends Archivist Meetings: Not on file  . Marital Status: Not on file     Family History: The patient's family history includes Diabetes in his mother; Stroke in his father.  ROS:   Review of Systems  Constitution: Positive for malaise/fatigue.  HENT: Negative.   Eyes: Negative.   Cardiovascular: Positive for dyspnea on exertion  and leg swelling.  Respiratory: Positive for cough and shortness of breath.   Endocrine: Negative.   Hematologic/Lymphatic: Negative.   Skin: Negative.   Musculoskeletal: Negative.   Gastrointestinal: Negative.   Genitourinary: Negative.   Neurological: Negative.   Psychiatric/Behavioral: Negative.   Allergic/Immunologic: Negative.    Please see the history of present illness.      All other systems reviewed and are negative.  EKGs/Labs/Other Studies Reviewed:    The following studies were reviewed today:   EKG:  EKG is  ordered today.  The ekg ordered today is personally reviewed and demonstrates sinus rhythm left anterior hemiblock  Recent Labs: No results found for requested labs within last 8760 hours.  Recent Lipid Panel No results found for: CHOL, TRIG, HDL, CHOLHDL, VLDL, LDLCALC, LDLDIRECT  Physical Exam:    VS:  BP (!) 110/58   Pulse 89   Ht 6\' 2"  (1.88 m)   Wt 273 lb (123.8 kg)   SpO2 97%   BMI 35.05 kg/m     Wt Readings from Last 3 Encounters:  03/09/19 273 lb (123.8 kg)  08/23/17 249 lb (112.9 kg)     GEN:  Well nourished, well developed in no acute distress HEENT: Normal NECK: No JVD; No carotid bruits LYMPHATICS: No lymphadenopathy CARDIAC: RRR, no murmurs, rubs, gallops RESPIRATORY:  Clear to auscultation without rales, wheezing or rhonchi  ABDOMEN: Soft, non-tender, non-distended MUSCULOSKELETAL: Plus bilateral  lower extremity pitting edema; No deformity  SKIN: Warm and dry NEUROLOGIC:  Alert and oriented x 3 PSYCHIATRIC:  Normal affect     Signed, Shirlee More, MD  03/09/2019 10:48 AM    [place]

## 2019-03-09 ENCOUNTER — Encounter: Payer: Self-pay | Admitting: Cardiology

## 2019-03-09 ENCOUNTER — Ambulatory Visit (INDEPENDENT_AMBULATORY_CARE_PROVIDER_SITE_OTHER): Payer: Medicare Other | Admitting: Cardiology

## 2019-03-09 ENCOUNTER — Other Ambulatory Visit: Payer: Self-pay

## 2019-03-09 VITALS — BP 110/58 | HR 89 | Ht 74.0 in | Wt 273.0 lb

## 2019-03-09 DIAGNOSIS — R0600 Dyspnea, unspecified: Secondary | ICD-10-CM

## 2019-03-09 DIAGNOSIS — M7989 Other specified soft tissue disorders: Secondary | ICD-10-CM | POA: Diagnosis not present

## 2019-03-09 DIAGNOSIS — R0609 Other forms of dyspnea: Secondary | ICD-10-CM

## 2019-03-09 DIAGNOSIS — Z8616 Personal history of COVID-19: Secondary | ICD-10-CM

## 2019-03-09 DIAGNOSIS — I2510 Atherosclerotic heart disease of native coronary artery without angina pectoris: Secondary | ICD-10-CM

## 2019-03-09 DIAGNOSIS — I444 Left anterior fascicular block: Secondary | ICD-10-CM

## 2019-03-09 DIAGNOSIS — Z794 Long term (current) use of insulin: Secondary | ICD-10-CM | POA: Diagnosis not present

## 2019-03-09 DIAGNOSIS — E119 Type 2 diabetes mellitus without complications: Secondary | ICD-10-CM | POA: Diagnosis not present

## 2019-03-09 DIAGNOSIS — J9811 Atelectasis: Secondary | ICD-10-CM | POA: Diagnosis not present

## 2019-03-09 HISTORY — DX: Personal history of COVID-19: Z86.16

## 2019-03-09 NOTE — Patient Instructions (Signed)
Medication Instructions:  Try to minimize your use of Gabapentin  *If you need a refill on your cardiac medications before your next appointment, please call your pharmacy*  Lab Work: Your physician recommends that you have a PROBNP drawn  If you have labs (blood work) drawn today and your tests are completely normal, you will receive your results only by: Marland Kitchen MyChart Message (if you have MyChart) OR . A paper copy in the mail If you have any lab test that is abnormal or we need to change your treatment, we will call you to review the results.  Testing/Procedures: You had an EKG performed today  A chest x-ray takes a picture of the organs and structures inside the chest, including the heart, lungs, and blood vessels. This test can show several things, including, whether the heart is enlarges; whether fluid is building up in the lungs; and whether pacemaker / defibrillator leads are still in place.  Your physician has requested that you have an echocardiogram. Echocardiography is a painless test that uses sound waves to create images of your heart. It provides your doctor with information about the size and shape of your heart and how well your heart's chambers and valves are working. This procedure takes approximately one hour. There are no restrictions for this procedure.    Follow-Up: At [facility], you and your health needs are our priority.  As part of our continuing mission to provide you with exceptional heart care, we have created designated Provider Care Teams.  These Care Teams include your primary Cardiologist (physician) and Advanced Practice Providers (APPs -  Physician Assistants and Nurse Practitioners) who all work together to provide you with the care you need, when you need it.  Your next appointment:   6 week(s) post echo  The format for your next appointment:   In Person  Provider:   Shirlee More, MD  Other Instructions  Echocardiogram An echocardiogram is a  procedure that uses painless sound waves (ultrasound) to produce an image of the heart. Images from an echocardiogram can provide important information about:  Signs of coronary artery disease (CAD).  Aneurysm detection. An aneurysm is a weak or damaged part of an artery wall that bulges out from the normal force of blood pumping through the body.  Heart size and shape. Changes in the size or shape of the heart can be associated with certain conditions, including heart failure, aneurysm, and CAD.  Heart muscle function.  Heart valve function.  Signs of a past heart attack.  Fluid buildup around the heart.  Thickening of the heart muscle.  A tumor or infectious growth around the heart valves. Tell a health care provider about:  Any allergies you have.  All medicines you are taking, including vitamins, herbs, eye drops, creams, and over-the-counter medicines.  Any blood disorders you have.  Any surgeries you have had.  Any medical conditions you have.  Whether you are pregnant or may be pregnant. What are the risks? Generally, this is a safe procedure. However, problems may occur, including:  Allergic reaction to dye (contrast) that may be used during the procedure. What happens before the procedure? No specific preparation is needed. You may eat and drink normally. What happens during the procedure?   An IV tube may be inserted into one of your veins.  You may receive contrast through this tube. A contrast is an injection that improves the quality of the pictures from your heart.  A gel will be applied to  your chest.  A wand-like tool (transducer) will be moved over your chest. The gel will help to transmit the sound waves from the transducer.  The sound waves will harmlessly bounce off of your heart to allow the heart images to be captured in real-time motion. The images will be recorded on a computer. The procedure may vary among health care providers and  hospitals. What happens after the procedure?  You may return to your normal, everyday life, including diet, activities, and medicines, unless your health care provider tells you not to do that. Summary  An echocardiogram is a procedure that uses painless sound waves (ultrasound) to produce an image of the heart.  Images from an echocardiogram can provide important information about the size and shape of your heart, heart muscle function, heart valve function, and fluid buildup around your heart.  You do not need to do anything to prepare before this procedure. You may eat and drink normally.  After the echocardiogram is completed, you may return to your normal, everyday life, unless your health care provider tells you not to do that. This information is not intended to replace advice given to you by your health care provider. Make sure you discuss any questions you have with your health care provider. Document Revised: 05/18/2018 Document Reviewed: 02/28/2016 Elsevier Patient Education  [place].

## 2019-03-10 LAB — PRO B NATRIURETIC PEPTIDE: NT-Pro BNP: 43 pg/mL (ref 0–376)

## 2019-04-01 ENCOUNTER — Ambulatory Visit: Payer: Medicare Other | Attending: Internal Medicine

## 2019-04-01 ENCOUNTER — Other Ambulatory Visit: Payer: Self-pay

## 2019-04-01 DIAGNOSIS — Z23 Encounter for immunization: Secondary | ICD-10-CM | POA: Insufficient documentation

## 2019-04-01 NOTE — Progress Notes (Signed)
   Covid-19 Vaccination Clinic  Name:  Travis Newman    MRN: [id number] DOB: Dec 08, 1945  04/01/2019  [name] was observed post Covid-19 immunization for 15 minutes without incidence. He was provided with Vaccine Information Sheet and instruction to access the V-Safe system.   [name] was instructed to call 911 with any severe reactions post vaccine: Marland Kitchen Difficulty breathing  . Swelling of your face and throat  . A fast heartbeat  . A bad rash all over your body  . Dizziness and weakness    Immunizations Administered    Name Date Dose VIS Date Route   Pfizer COVID-19 Vaccine 04/01/2019 11:53 AM 0.3 mL 01/19/2019 Intramuscular   Manufacturer: [place]   Lot: [id number]   [place]: [id number]

## 2019-04-24 ENCOUNTER — Ambulatory Visit: Payer: Medicare Other | Attending: Internal Medicine

## 2019-04-24 DIAGNOSIS — Z23 Encounter for immunization: Secondary | ICD-10-CM

## 2019-04-24 NOTE — Progress Notes (Signed)
   Covid-19 Vaccination Clinic  Name:  [name]    MRN: [id number] DOB: 11-Sep-1945  04/24/2019  Travis Newman was observed post Covid-19 immunization for 15 minutes without incident. He was provided with Vaccine Information Sheet and instruction to access the V-Safe system.   Travis Newman was instructed to call 911 with any severe reactions post vaccine: Marland Kitchen Difficulty breathing  . Swelling of face and throat  . A fast heartbeat  . A bad rash all over body  . Dizziness and weakness   Immunizations Administered    Name Date Dose VIS Date Route   Pfizer COVID-19 Vaccine 04/24/2019 10:43 AM 0.3 mL 01/19/2019 Intramuscular   Manufacturer: [place]   Lot: [id number]   [place]: [id number]

## 2019-04-26 ENCOUNTER — Ambulatory Visit (INDEPENDENT_AMBULATORY_CARE_PROVIDER_SITE_OTHER): Payer: Medicare Other

## 2019-04-26 ENCOUNTER — Other Ambulatory Visit: Payer: Self-pay

## 2019-04-26 DIAGNOSIS — I2510 Atherosclerotic heart disease of native coronary artery without angina pectoris: Secondary | ICD-10-CM

## 2019-04-26 DIAGNOSIS — I444 Left anterior fascicular block: Secondary | ICD-10-CM

## 2019-04-26 NOTE — Progress Notes (Unsigned)
Complete echocardiogram has been performed.  Jimmy Lavella Myren RDCS, RVT 

## 2019-05-01 ENCOUNTER — Telehealth: Payer: Self-pay

## 2019-05-01 NOTE — Telephone Encounter (Signed)
-----   Message from Richardo Priest, MD sent at 04/27/2019 12:02 PM EDT ----- Normal or stable result  No finding of heart failure

## 2019-05-01 NOTE — Telephone Encounter (Signed)
The patient has been notified of the Echo result and verbalized understanding.  All questions (if any) were answered. Frederik Schmidt, RN 05/01/2019 10:43 AM

## 2019-05-24 DIAGNOSIS — L820 Inflamed seborrheic keratosis: Secondary | ICD-10-CM | POA: Diagnosis not present

## 2019-06-06 NOTE — Progress Notes (Signed)
Cardiology Office Note:    Date:  06/07/2019   ID:  Travis Newman, DOB 1945/07/27, MRN [id number]  PCP:  Delorise Shiner, MD  Cardiologist:  Shirlee More, MD    Referring MD: Delorise Shiner, MD    ASSESSMENT:    1. Cardiac risk counseling   2. Leg swelling   3. Type 2 diabetes mellitus without complication, with long-term current use of insulin ([place])   4. Mixed hyperlipidemia    PLAN:    In order of problems listed above:  1. After discussion of cardiovascular risk to feel cardiac CTA.  Score is high would benefit from ischemic evaluation and statin. 2. Improved I have asked him to stop taking his alpha-blocker. 3. Improved now on insulin managed by endocrinology [facility] 4. Continue nonstatin Zetia   Next appointment: 6 weeks   Medication Adjustments/Labs and Tests Ordered: Current medicines are reviewed at length with the patient today.  Concerns regarding medicines are outlined above.  Orders Placed This Encounter  Procedures  . CT CARDIAC SCORING   No orders of the defined types were placed in this encounter.   Chief Complaint  Patient presents with  . Follow-up    History of Present Illness:    Travis Newman is a 74 y.o. male with a hx of COVID-19 infection, edema shortness of breath and a history of remote SVT.  He was last seen 03/09/2019.  2 medications associated with edema gabapentin and alpha blocker was discontinued. Compliance with diet, lifestyle and medications: Yes  He is improved is now on insulin for his diabetes and continues to take an alpha-blocker for BPH.  He certainly is at risk for heart failure and cardiovascular complications have asked him to stop his alpha-blocker monitor his urinary function let us know if his systolics are greater than 140 and next time I see him address whether he should take an ARB for prophylaxis.  We discussed his increased cardiovascular risk with diabetes and agent orange and he will get a coronary artery  calcium score.  If he has a very high number he may benefit from an ischemia evaluation.  His proBNP level was quite low at 43, echocardiogram showed normal ejection fraction moderate LVH normal right ventricular function and pulmonary artery pressures and mild mitral regurgitation.  Chest x-ray 03/09/2019 I personally reviewed  mild atelectasis otherwise normal no evidence of heart failure Past Medical History:  Diagnosis Date  . Arthritis   . COPD (chronic obstructive pulmonary disease) ([place])   . Diabetes mellitus without complication ([place])   . History of COVID-19 03/09/2019  . Left knee pain 03/21/2014  . Primary osteoarthritis of left knee 03/21/2014  . Pyogenic granuloma of tongue 02/07/2016  . Status post total right knee replacement 03/21/2014  . Tachycardia   . Tongue lesion 01/15/2016  . Type 2 diabetes mellitus without complication, with long-term current use of insulin ([place]) 01/15/2016    Past Surgical History:  Procedure Laterality Date  . KNEE SURGERY Right   . 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 ROTATOR CUFF REPAIR Right   . TONSILLECTOMY      Current Medications: Current Meds  Medication Sig  . ezetimibe (ZETIA) 10 MG tablet Take 10 mg by mouth daily.  . finasteride (PROSCAR) 5 MG tablet Take 5 mg by mouth daily.  Marland Kitchen gabapentin (NEURONTIN) 300 MG capsule Take 300 mg by mouth 3 (three) times daily.  . insulin aspart (NOVOLOG) 100 UNIT/ML injection Inject 18 Units into the skin  3 (three) times daily before meals.  . metFORMIN (GLUCOPHAGE) 500 MG tablet Take 500 mg by mouth 2 (two) times daily with a meal.  . metoprolol tartrate (LOPRESSOR) 25 MG tablet Take 25 mg by mouth daily.  Marland Kitchen omeprazole (PRILOSEC) 20 MG capsule Take 20 mg by mouth daily as needed. Acid reflux  . Semaglutide (OZEMPIC, 1 MG/DOSE, [place]) Inject into the skin once a week.  . zolpidem (AMBIEN) 10 MG tablet Take 10 mg by mouth at bedtime as needed for sleep.  . [DISCONTINUED] doxazosin (CARDURA) 8 MG  tablet Take 8 mg by mouth at bedtime.     Allergies:   Penicillins, Lidocaine-epinephrine, and Clindamycin hcl   Social History   Socioeconomic History  . Marital status: Married    Spouse name: Not on file  . Number of children: Not on file  . Years of education: Not on file  . Highest education level: Not on file  Occupational History  . Not on file  Tobacco Use  . Smoking status: Current Some Day Smoker    Types: Cigars  . Smokeless tobacco: Former Network engineer and Sexual Activity  . Alcohol use: Never  . Drug use: Never  . Sexual activity: Not Currently  Other Topics Concern  . Not on file  Social History Narrative  . Not on file   Social Determinants of Health   Financial Resource Strain:   . Difficulty of Paying Living Expenses:   Food Insecurity:   . Worried About Charity fundraiser in the Last Year:   . Arboriculturist in the Last Year:   Transportation Needs:   . Film/video editor (Medical):   Marland Kitchen Lack of Transportation (Non-Medical):   Physical Activity:   . Days of Exercise per Week:   . Minutes of Exercise per Session:   Stress:   . Feeling of Stress :   Social Connections:   . Frequency of Communication with Friends and Family:   . Frequency of Social Gatherings with Friends and Family:   . Attends Religious Services:   . Active Member of Clubs or Organizations:   . Attends Archivist Meetings:   Marland Kitchen Marital Status:      Family History: The patient's family history includes Diabetes in his mother; Stroke in his father. ROS:   Please see the history of present illness.    All other systems reviewed and are negative.  EKGs/Labs/Other Studies Reviewed:    The following studies were reviewed today:    Recent Labs: 03/09/2019: NT-Pro BNP 43  Recent hemoglobin A1c via hospital 7.4%   Physical Exam:    VS:  BP 110/78 (BP Location: Right Arm, Patient Position: Sitting, Cuff Size: Normal)   Pulse 90   Ht 6\' 2"  (1.88 m)   Wt  268 lb (121.6 kg)   SpO2 93%   BMI 34.41 kg/m     Wt Readings from Last 3 Encounters:  06/07/19 268 lb (121.6 kg)  03/09/19 273 lb (123.8 kg)  08/23/17 249 lb (112.9 kg)     GEN:  Well nourished, well developed in no acute distress HEENT: Normal NECK: No JVD; No carotid bruits LYMPHATICS: No lymphadenopathy CARDIAC: =RRR, no murmurs, rubs, gallops RESPIRATORY:  Clear to auscultation without rales, wheezing or rhonchi  ABDOMEN: Soft, non-tender, non-distended MUSCULOSKELETAL:  No edema; No deformity  SKIN: Warm and dry NEUROLOGIC:  Alert and oriented x 3 PSYCHIATRIC:  Normal affect    Signed, Shirlee More, MD  06/07/2019 10:00 AM    [facility]

## 2019-06-07 ENCOUNTER — Other Ambulatory Visit: Payer: Self-pay

## 2019-06-07 ENCOUNTER — Encounter: Payer: Self-pay | Admitting: Cardiology

## 2019-06-07 ENCOUNTER — Ambulatory Visit (INDEPENDENT_AMBULATORY_CARE_PROVIDER_SITE_OTHER): Payer: Medicare Other | Admitting: Cardiology

## 2019-06-07 VITALS — BP 110/78 | HR 90 | Ht 74.0 in | Wt 268.0 lb

## 2019-06-07 DIAGNOSIS — E782 Mixed hyperlipidemia: Secondary | ICD-10-CM | POA: Diagnosis not present

## 2019-06-07 DIAGNOSIS — Z7189 Other specified counseling: Secondary | ICD-10-CM | POA: Diagnosis not present

## 2019-06-07 DIAGNOSIS — Z794 Long term (current) use of insulin: Secondary | ICD-10-CM | POA: Diagnosis not present

## 2019-06-07 DIAGNOSIS — M7989 Other specified soft tissue disorders: Secondary | ICD-10-CM | POA: Diagnosis not present

## 2019-06-07 DIAGNOSIS — E119 Type 2 diabetes mellitus without complications: Secondary | ICD-10-CM | POA: Diagnosis not present

## 2019-06-07 NOTE — Patient Instructions (Addendum)
Medication Instructions:  Your physician has recommended you make the following change in your medication:  STOP: Cardura *If you need a refill on your cardiac medications before your next appointment, please call your pharmacy*   Lab Work: None If you have labs (blood work) drawn today and your tests are completely normal, you will receive your results only by: Marland Kitchen MyChart Message (if you have MyChart) OR . A paper copy in the mail If you have any lab test that is abnormal or we need to change your treatment, we will call you to review the results.   Testing/Procedures: We will order CT coronary calcium score   $150  Please call [phone] to schedule    [facility]  [address], [place] [address]   Follow-Up: At [facility], you and your health needs are our priority.  As part of our continuing mission to provide you with exceptional heart care, we have created designated Provider Care Teams.  These Care Teams include your primary Cardiologist (physician) and Advanced Practice Providers (APPs -  Physician Assistants and Nurse Practitioners) who all work together to provide you with the care you need, when you need it.  We recommend signing up for the patient portal called "MyChart".  Sign up information is provided on this After Visit Summary.  MyChart is used to connect with patients for Virtual Visits (Telemedicine).  Patients are able to view lab/test results, encounter notes, upcoming appointments, etc.  Non-urgent messages can be sent to your provider as well.   To learn more about what you can do with MyChart, go to [email or web address].    Your next appointment:   6 week(s)  The format for your next appointment:   In Person  Provider:   Shirlee More, MD   Other Instructions  If your home blood pressure gets above 140 on the top please call and let our office know.

## 2019-06-25 DIAGNOSIS — R238 Other skin changes: Secondary | ICD-10-CM | POA: Diagnosis not present

## 2019-06-25 DIAGNOSIS — N400 Enlarged prostate without lower urinary tract symptoms: Secondary | ICD-10-CM | POA: Diagnosis not present

## 2019-07-02 ENCOUNTER — Ambulatory Visit (INDEPENDENT_AMBULATORY_CARE_PROVIDER_SITE_OTHER)
Admission: RE | Admit: 2019-07-02 | Discharge: 2019-07-02 | Disposition: A | Discharge status: Home / Self Care | Payer: Self-pay | Source: Ambulatory Visit | Attending: Cardiology | Admitting: Cardiology

## 2019-07-02 ENCOUNTER — Other Ambulatory Visit: Payer: Self-pay

## 2019-07-02 DIAGNOSIS — Z7189 Other specified counseling: Secondary | ICD-10-CM

## 2019-07-02 DIAGNOSIS — I700 Atherosclerosis of aorta: Secondary | ICD-10-CM | POA: Diagnosis not present

## 2019-07-02 DIAGNOSIS — J929 Pleural plaque without asbestos: Secondary | ICD-10-CM | POA: Diagnosis not present

## 2019-07-04 ENCOUNTER — Telehealth: Payer: Self-pay

## 2019-07-04 NOTE — Telephone Encounter (Signed)
-----   Message from Richardo Priest, MD sent at 07/04/2019  7:50 AM EDT ----- Whichever he prefers ----- Message ----- From: Gita Kudo, RN Sent: 07/03/2019   5:01 PM EDT To: Richardo Priest, MD  Just to clarify, did you want to go ahead and schedule the patient for a cardiac CT or did you want to wait and discuss it with him at his appointment with you in two weeks? Also, did you want the BMP now or can he have it done at his next appointment?  ----- Message ----- From: Richardo Priest, MD Sent: 07/03/2019   4:56 PM EDT To: Manya Silvas Triage  Normal or stable result  His calcium score is elevated but absolute number and percentile for age and sex I think for better understanding he would benefit from a cardiac CTA and if agreeable we will recheck electrolytes for now.  The ascending aorta is mildly enlarged it is not an aneurysm.

## 2019-07-04 NOTE — Telephone Encounter (Signed)
Spoke with patients wife regarding results and recommendation.  She verbalizes understanding and states that they would like to discuss this with Dr. Bettina Gavia during the patients appointment on 07/19/19 before scheduling. I let her know that this was fine and that we would have to do labs on that day as well since they have to be done prior to the CT.   Advised patient to call back with any issues or concerns.

## 2019-07-18 NOTE — Progress Notes (Signed)
Cardiology Office Note:    Date:  07/19/2019   ID:  BRENN DEZIEL, DOB [birth year], MRN [id number]  PCP:  Delorise Shiner, MD  Cardiologist:  Shirlee More, MD    Referring MD: Delorise Shiner, MD    ASSESSMENT:    1. High coronary artery calcium score   2. Type 2 diabetes mellitus without complication, with long-term current use of insulin ([place])   3. Mixed hyperlipidemia    PLAN:    In order of problems listed above:  1. For further evaluation undergo cardiac CTA if he has high risk anatomy would benefit from revascularization.  To optimize medical therapy he will add a high intensity statin to his Zetia and continue his treatment for hyperlipidemia and diabetes managed by primary care at the [facility].  Next visit I will do a lipid profile 2. Continue current treatment 3. Optimized by adding statin   Next appointment: 6 weeks   Medication Adjustments/Labs and Tests Ordered: Current medicines are reviewed at length with the patient today.  Concerns regarding medicines are outlined above.  No orders of the defined types were placed in this encounter.  No orders of the defined types were placed in this encounter.   No chief complaint on file.   History of Present Illness:    Travis Newman is a 74 y.o. male with a hx of agent orange exposure and type 2 diabetes COVID-19 infection, edema shortness of breath and a history of remote SVT last seen 06/07/2019.His proBNP level was quite low at 43, echocardiogram showed normal ejection fraction moderate LVH normal right ventricular function and pulmonary artery pressures and mild mitral regurgitation.  Chest x-ray 03/09/2019 I personally reviewed  mild atelectasis otherwise normal no evidence of heart failure   Compliance with diet, lifestyle and medications: Yes  He has good healthcare literacy we had an opportunity to sit down and go through his coronary calcium score he understands that he is calcium score is in a high risk  group with three-vessel calcification and for further evaluation he will undergo cardiac CTA.  He has no dye allergy and has normal kidney function.  He is not having angina shortness of breath palpitation or syncope.  He is not on a statin as no documented intolerance and will add rosuvastatin to his Zetia.  I will see back in the office in 6 weeks after his CTA is performed.  I also told him what he has is mild enlargement ascending aorta not uncommon in his age group and he does not have an aneurysm.  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 Past Medical History:  Diagnosis Date   Arthritis    COPD (chronic obstructive pulmonary disease) ([place])    Diabetes mellitus without complication ([place])    History of COVID-19 03/09/2019   Left knee pain 03/21/2014   Primary osteoarthritis of left knee 03/21/2014   Pyogenic granuloma of tongue 02/07/2016   Status post total right knee replacement 03/21/2014   Tachycardia    Tongue lesion 01/15/2016   Type 2 diabetes mellitus without complication, with long-term current use of insulin ([place]) 01/15/2016    Past Surgical History:  Procedure Laterality Date   KNEE SURGERY Right    REPLACEMENT TOTAL KNEE Left 08/16/2017   ROTATOR CUFF REPAIR Right    TONSILLECTOMY      Current  Medications: Current Meds  Medication Sig   ezetimibe (ZETIA) 10 MG tablet Take 10 mg by mouth daily.   finasteride (PROSCAR) 5 MG tablet Take 5 mg by mouth daily.   gabapentin (NEURONTIN) 300 MG capsule Take 300 mg by mouth 3 (three) times daily.   insulin aspart (NOVOLOG) 100 UNIT/ML injection Inject 18 Units into the skin 3 (three) times daily before meals.   metFORMIN (GLUCOPHAGE) 500 MG tablet Take 500 mg by mouth 2 (two) times daily with a meal.   metoprolol tartrate (LOPRESSOR) 25 MG tablet  Take 25 mg by mouth daily.   omeprazole (PRILOSEC) 20 MG capsule Take 20 mg by mouth daily as needed. Acid reflux   Semaglutide (OZEMPIC, 1 MG/DOSE, [place]) Inject into the skin once a week.   zolpidem (AMBIEN) 10 MG tablet Take 10 mg by mouth at bedtime as needed for sleep.     Allergies:   Penicillins, Lidocaine-epinephrine, and Clindamycin hcl   Social History   Socioeconomic History   Marital status: Married    Spouse name: Not on file   Number of children: Not on file   Years of education: Not on file   Highest education level: Not on file  Occupational History   Not on file  Tobacco Use   Smoking status: Current Some Day Smoker    Types: Cigars   Smokeless tobacco: Former Counsellor Use: Never used  Substance and Sexual Activity   Alcohol use: Never   Drug use: Never   Sexual activity: Not Currently  Other Topics Concern   Not on file  Social History Narrative   Not on file   Social Determinants of Health   Financial Resource Strain:    Difficulty of Paying Living Expenses:   Food Insecurity:    Worried About Charity fundraiser in the Last Year:    Arboriculturist in the Last Year:   Transportation Needs:    Film/video editor (Medical):    Lack of Transportation (Non-Medical):   Physical Activity:    Days of Exercise per Week:    Minutes of Exercise per Session:   Stress:    Feeling of Stress :   Social Connections:    Frequency of Communication with Friends and Family:    Frequency of Social Gatherings with Friends and Family:    Attends Religious Services:    Active Member of Clubs or Organizations:    Attends Archivist Meetings:    Marital Status:      Family History: The patient's family history includes Diabetes in his mother; Stroke in his father. ROS:   Please see the history of present illness.    All other systems reviewed and are negative.  EKGs/Labs/Other Studies Reviewed:     The following studies were reviewed today:  Recent Labs: 03/09/2019: NT-Pro BNP 43  Recent hemoglobin A1c via hospital 7.4%  Physical Exam:    VS:  BP 132/72 (BP Location: Right Arm, Patient Position: Sitting, Cuff Size: Normal)    Pulse 94    Ht 6\' 3"  (1.905 m)    Wt 266 lb 3.2 oz (120.7 kg)    SpO2 94%    BMI 33.27 kg/m     Wt Readings from Last 3 Encounters:  07/19/19 266 lb 3.2 oz (120.7 kg)  06/07/19 268 lb (121.6 kg)  03/09/19 273 lb (123.8 kg)     GEN:  Well nourished, well developed in no acute distress  HEENT: Normal NECK: No JVD; No carotid bruits LYMPHATICS: No lymphadenopathy CARDIAC: RRR, no murmurs, rubs, gallops RESPIRATORY:  Clear to auscultation without rales, wheezing or rhonchi  ABDOMEN: Soft, non-tender, non-distended MUSCULOSKELETAL:  No edema; No deformity  SKIN: Warm and dry NEUROLOGIC:  Alert and oriented x 3 PSYCHIATRIC:  Normal affect    Signed, Shirlee More, MD  07/19/2019 9:36 AM    [place]

## 2019-07-19 ENCOUNTER — Other Ambulatory Visit: Payer: Self-pay

## 2019-07-19 ENCOUNTER — Encounter: Payer: Self-pay | Admitting: Cardiology

## 2019-07-19 ENCOUNTER — Ambulatory Visit (INDEPENDENT_AMBULATORY_CARE_PROVIDER_SITE_OTHER): Payer: Medicare Other | Admitting: Cardiology

## 2019-07-19 VITALS — BP 132/72 | HR 94 | Ht 75.0 in | Wt 266.2 lb

## 2019-07-19 DIAGNOSIS — R079 Chest pain, unspecified: Secondary | ICD-10-CM | POA: Diagnosis not present

## 2019-07-19 DIAGNOSIS — R931 Abnormal findings on diagnostic imaging of heart and coronary circulation: Secondary | ICD-10-CM

## 2019-07-19 DIAGNOSIS — Z794 Long term (current) use of insulin: Secondary | ICD-10-CM | POA: Diagnosis not present

## 2019-07-19 DIAGNOSIS — R9430 Abnormal result of cardiovascular function study, unspecified: Secondary | ICD-10-CM

## 2019-07-19 DIAGNOSIS — E782 Mixed hyperlipidemia: Secondary | ICD-10-CM

## 2019-07-19 DIAGNOSIS — E119 Type 2 diabetes mellitus without complications: Secondary | ICD-10-CM

## 2019-07-19 MED ORDER — ROSUVASTATIN CALCIUM 10 MG PO TABS: 10.0000 mg | ORAL_TABLET | Freq: Every day | ORAL | 3 refills | Status: AC

## 2019-07-19 NOTE — Patient Instructions (Signed)
Medication Instructions:  Your physician has recommended you make the following change in your medication:  START: Rosuvastatin 10 mg take one tablet by mouth daily at bedtime.  *If you need a refill on your cardiac medications before your next appointment, please call your pharmacy*   Lab Work: Your physician recommends that you return for lab work in: Within one week of your cardiac CT.  If you have labs (blood work) drawn today and your tests are completely normal, you will receive your results only by: Marland Kitchen MyChart Message (if you have MyChart) OR . A paper copy in the mail If you have any lab test that is abnormal or we need to change your treatment, we will call you to review the results.   Testing/Procedures: Your cardiac CT will be scheduled at the below location:   [facility] [address] [place], [place] [address] [phone]  If scheduled at [facility], please arrive at the [facility] main entrance of [facility] 30 minutes prior to test start time. Proceed to the [facility] Radiology Department (first floor) to check-in and test prep.   Please follow these instructions carefully (unless otherwise directed):  On the Night Before the Test: . Be sure to Drink plenty of water. . Do not consume any caffeinated/decaffeinated beverages or chocolate 12 hours prior to your test. . Do not take any antihistamines 12 hours prior to your test.  On the Day of the Test: . Drink plenty of water. Do not drink any water within one hour of the test. . Do not eat any food 4 hours prior to the test. . You may take your regular medications prior to the test.  . Take metoprolol (Lopressor) two hours prior to test.        After the Test: . Drink plenty of water. . After receiving IV contrast, you may experience a mild flushed feeling. This is normal. . On occasion, you may experience a mild rash up to 24 hours after the test. This is not dangerous. If  this occurs, you can take Benadryl 25 mg and increase your fluid intake. . If you experience trouble breathing, this can be serious. If it is severe call 911 IMMEDIATELY. If it is mild, please call our office. . If you take any of these medications: Glipizide/Metformin, Avandament, Glucavance, please do not take 48 hours after completing test unless otherwise instructed.   Once we have confirmed authorization from your insurance company, we will call you to set up a date and time for your test.   For non-scheduling related questions, please contact the cardiac imaging nurse navigator should you have any questions/concerns: Marchia Bond, Cardiac Imaging Nurse Navigator Burley Saver, Interim Cardiac Imaging Nurse [place] [facility] Services Direct Office Dial: [phone]   For scheduling needs, including cancellations and rescheduling, please call [phone].      Follow-Up: At [facility], you and your health needs are our priority.  As part of our continuing mission to provide you with exceptional heart care, we have created designated Provider Care Teams.  These Care Teams include your primary Cardiologist (physician) and Advanced Practice Providers (APPs -  Physician Assistants and Nurse Practitioners) who all work together to provide you with the care you need, when you need it.  We recommend signing up for the patient portal called "MyChart".  Sign up information is provided on this After Visit Summary.  MyChart is used to connect with patients for  Virtual Visits (Telemedicine).  Patients are able to view lab/test results, encounter notes, upcoming appointments, etc.  Non-urgent messages can be sent to your provider as well.   To learn more about what you can do with MyChart, go to [email or web address].    Your next appointment:   8 week(s)  The format for your next appointment:   In Person  Provider:   Shirlee More, MD   Other Instructions

## 2019-07-20 ENCOUNTER — Telehealth: Payer: Self-pay | Admitting: Cardiology

## 2019-07-20 NOTE — Telephone Encounter (Signed)
I do not think Covid played a role  I am concerned about the potential of coronary artery disease or blockage and that is the reason for the cardiac CTA that is scheduled

## 2019-07-20 NOTE — Telephone Encounter (Signed)
Patient calling to ask if Dr. Bettina Gavia suspects the damage done to his heart was caused by covid. He would also like to know if he suspects there are any significant other blockages somewhere? He states the answer can be left in a message on his phone if he does not answer or on mychart.

## 2019-07-20 NOTE — Telephone Encounter (Signed)
Spoke to the patient just now and let him know Dr. Joya Gaskins comments and recommendations. He verbalizes understanding and does not have any other issues or concerns at this time.    Encouraged patient to call back with any questions or concerns.

## 2019-07-30 ENCOUNTER — Telehealth: Payer: Self-pay | Admitting: Gastroenterology

## 2019-07-30 NOTE — Telephone Encounter (Signed)
Hi Gloria Do we have any records? RG

## 2019-08-07 NOTE — Telephone Encounter (Signed)
Dr. Lyndel Safe reviewed records and okay to schedule direct colonoscopy.  Called and LM on vmail to for pt to call back to schedule.  Records at [facility] office

## 2019-08-08 ENCOUNTER — Encounter: Payer: Self-pay | Admitting: Gastroenterology

## 2019-08-09 ENCOUNTER — Telehealth (HOSPITAL_COMMUNITY): Payer: Self-pay | Admitting: *Deleted

## 2019-08-09 NOTE — Telephone Encounter (Signed)
Reaching out to patient to offer assistance regarding upcoming cardiac imaging study; pt verified current allergies.  Pt provided email to have instructions sent. Call back information given should any questions or concerns arise.  Pt states he will get lab work on 7/2.  ° °Merle Tai RN Navigator Cardiac Imaging °[facility] °[phone] office °[phone] cell ° °

## 2019-08-10 LAB — BASIC METABOLIC PANEL
BUN/Creatinine Ratio: 16 (ref 10–24)
BUN: 14 mg/dL (ref 8–27)
CO2: 21 mmol/L (ref 20–29)
Calcium: 9.2 mg/dL (ref 8.6–10.2)
Chloride: 100 mmol/L (ref 96–106)
Creatinine, Ser: 0.87 mg/dL (ref 0.76–1.27)
GFR calc Af Amer: 100 mL/min/{1.73_m2} (ref >59)
GFR calc non Af Amer: 87 mL/min/{1.73_m2} (ref >59)
Glucose: 187 mg/dL — ABNORMAL HIGH (ref 65–99)
Potassium: 4.1 mmol/L (ref 3.5–5.2)
Sodium: 137 mmol/L (ref 134–144)

## 2019-08-14 ENCOUNTER — Other Ambulatory Visit: Payer: Self-pay

## 2019-08-14 ENCOUNTER — Ambulatory Visit (HOSPITAL_COMMUNITY)
Admission: RE | Admit: 2019-08-14 | Discharge: 2019-08-14 | Disposition: A | Discharge status: Home / Self Care | Payer: Medicare Other | Source: Ambulatory Visit | Attending: Cardiology | Admitting: Cardiology

## 2019-08-14 ENCOUNTER — Encounter (HOSPITAL_COMMUNITY): Payer: Self-pay

## 2019-08-14 ENCOUNTER — Telehealth: Payer: Self-pay

## 2019-08-14 ENCOUNTER — Other Ambulatory Visit (HOSPITAL_COMMUNITY): Payer: Self-pay | Admitting: *Deleted

## 2019-08-14 ENCOUNTER — Encounter: Payer: Medicare Other | Admitting: *Deleted

## 2019-08-14 DIAGNOSIS — R9430 Abnormal result of cardiovascular function study, unspecified: Secondary | ICD-10-CM | POA: Diagnosis not present

## 2019-08-14 DIAGNOSIS — R079 Chest pain, unspecified: Secondary | ICD-10-CM | POA: Insufficient documentation

## 2019-08-14 DIAGNOSIS — Z7901 Long term (current) use of anticoagulants: Secondary | ICD-10-CM | POA: Diagnosis not present

## 2019-08-14 DIAGNOSIS — Z79899 Other long term (current) drug therapy: Secondary | ICD-10-CM | POA: Insufficient documentation

## 2019-08-14 DIAGNOSIS — Z006 Encounter for examination for normal comparison and control in clinical research program: Secondary | ICD-10-CM

## 2019-08-14 MED ORDER — IVABRADINE HCL 7.5 MG PO TABS: ORAL_TABLET | ORAL | 0 refills | Status: DC

## 2019-08-14 MED ORDER — METOPROLOL TARTRATE 5 MG/5ML IV SOLN
INTRAVENOUS | Status: AC
  Administered 2019-08-14: 5 mg
  Filled 2019-08-14: qty 5

## 2019-08-14 MED ORDER — DILTIAZEM HCL 25 MG/5ML IV SOLN
INTRAVENOUS | Status: AC
  Administered 2019-08-14 (×2): 5 mg
  Filled 2019-08-14: qty 5

## 2019-08-14 NOTE — Progress Notes (Signed)
Patient given Metoprolol 5 mg IV and Cardizem 10 mg IV with no change in HR>  Spoke with Dr Harriet Masson regarding patient status.  Per Dr Harriet Masson, patient to not have CT heart today, will be rescheduled for CT heart with Ibabradine.  Verbal orders read back and verified.  Patient informed of Dr orders, patient verbalized understanding.

## 2019-08-14 NOTE — Telephone Encounter (Signed)
Left message on patients voicemail to please return our call.   

## 2019-08-14 NOTE — Telephone Encounter (Signed)
Results reviewed with pt as per Dr. Munley's note.  Pt verbalized understanding and had no additional questions. Routed to PCP  

## 2019-08-14 NOTE — Telephone Encounter (Signed)
Follow up ° ° °Patient is returning call for results. Please call. °

## 2019-08-14 NOTE — Research (Signed)
CADFEM Informed Consent                  Subject Name:   Travis Newman   Subject met inclusion and exclusion criteria.  The informed consent form, study requirements and expectations were reviewed with the subject and questions and concerns were addressed prior to the signing of the consent form.  The subject verbalized understanding of the trial requirements.  The subject agreed to participate in the CADFEM trial and signed the informed consent.  The informed consent was obtained prior to performance of any protocol-specific procedures for the subject.  A copy of the signed informed consent was given to the subject and a copy was placed in the subject's medical record.   Kenya Chalmers, Research Assistant  08/14/2018  10:52 a.m. 

## 2019-08-15 ENCOUNTER — Telehealth: Payer: Self-pay | Admitting: Cardiology

## 2019-08-15 ENCOUNTER — Telehealth: Payer: Self-pay | Admitting: *Deleted

## 2019-08-15 MED ORDER — IVABRADINE HCL 7.5 MG PO TABS: ORAL_TABLET | ORAL | 0 refills | Status: DC

## 2019-08-15 NOTE — Telephone Encounter (Signed)
Was scheduled for CT scan and the metoprolol did not work and so ordered an alternative medication which none of the pharmacist have. We need to get Dr. Bettina Gavia to order it so that the pharmacy can special order it. Medication is Ivabradine HCL 7.5mg  for procedure to be done on 7/19. Pt is willing to pay whatever it costs just let him know when it is ready. Please advise

## 2019-08-15 NOTE — Telephone Encounter (Signed)
Patient stated that he had a CT yesterday but there was an issue with a medicine that was given to slow his heart rate, so the CT was rescheduled for 7/19. Patient stated a medicine was called into [facility] on [facility] in [place] that would be needed for the CT on 7/19, but then [facility] called him to advise that they are unable to get that medication. Patient wanted to inform the office incase changes needed to be made.

## 2019-08-15 NOTE — Telephone Encounter (Signed)
Spoke to patient and sent his prescription in to [facility] for him. He is going to let us know if he is able to get it through them or not.    Encouraged patient to call back with any questions or concerns.

## 2019-08-15 NOTE — Addendum Note (Signed)
Addended by: Resa Miner I on: 08/15/2019 10:50 AM   Modules accepted: Orders

## 2019-08-17 NOTE — Telephone Encounter (Signed)
Patient called and said he got his medication from the pharmacy. He appreciates all the help from the office

## 2019-08-23 ENCOUNTER — Telehealth (HOSPITAL_COMMUNITY): Payer: Self-pay | Admitting: *Deleted

## 2019-08-23 NOTE — Telephone Encounter (Signed)
Reaching out to patient to offer assistance regarding upcoming cardiac imaging study; pt verbalizes understanding of appt date/time, parking situation and where to check in, pre-test NPO status and medications ordered, and verified current allergies; name and call back number provided for further questions should they arise  Burley Saver RN [facility] [phone] office [phone] cell  Per Dr. Terrial Rhodes recommendation, pt will take 4m metoprolol and 7.561mivabradine 2 hours prior to test on Monday July 19. Pt verbalized understanding.

## 2019-08-27 ENCOUNTER — Other Ambulatory Visit: Payer: Self-pay

## 2019-08-27 ENCOUNTER — Ambulatory Visit (HOSPITAL_COMMUNITY)
Admission: RE | Admit: 2019-08-27 | Discharge: 2019-08-27 | Disposition: A | Discharge status: Home / Self Care | Payer: Medicare Other | Source: Ambulatory Visit | Attending: Cardiology | Admitting: Cardiology

## 2019-08-27 DIAGNOSIS — I700 Atherosclerosis of aorta: Secondary | ICD-10-CM | POA: Diagnosis not present

## 2019-08-27 DIAGNOSIS — R079 Chest pain, unspecified: Secondary | ICD-10-CM | POA: Diagnosis not present

## 2019-08-27 DIAGNOSIS — R9430 Abnormal result of cardiovascular function study, unspecified: Secondary | ICD-10-CM | POA: Insufficient documentation

## 2019-08-27 MED ORDER — NITROGLYCERIN 0.4 MG SL SUBL
SUBLINGUAL_TABLET | SUBLINGUAL | Status: AC
  Filled 2019-08-27: qty 2

## 2019-08-27 MED ORDER — IOHEXOL 350 MG/ML SOLN
80.0000 mL | Freq: Once | INTRAVENOUS | Status: AC | PRN
  Administered 2019-08-27: 80 mL via INTRAVENOUS

## 2019-08-27 MED ORDER — NITROGLYCERIN 0.4 MG SL SUBL
0.8000 mg | SUBLINGUAL_TABLET | Freq: Once | SUBLINGUAL | Status: AC
  Administered 2019-08-27: 0.8 mg via SUBLINGUAL

## 2019-08-28 ENCOUNTER — Telehealth: Payer: Self-pay

## 2019-08-28 NOTE — Telephone Encounter (Signed)
Spoke with patient regarding results and recommendation.  Patient verbalizes understanding and is agreeable to plan of care. Advised patient to call back with any issues or concerns.  

## 2019-08-28 NOTE — Telephone Encounter (Signed)
-----   Message from Richardo Priest, MD sent at 08/28/2019 12:24 PM EDT ----- Normal or stable result  Overall good result mild CAD not obstructing flow we can discuss further in the office.

## 2019-08-30 NOTE — Telephone Encounter (Signed)
Just a colonoscopy RG

## 2019-09-06 ENCOUNTER — Ambulatory Visit (AMBULATORY_SURGERY_CENTER): Payer: Self-pay

## 2019-09-06 ENCOUNTER — Other Ambulatory Visit: Payer: Self-pay

## 2019-09-06 VITALS — Ht 75.0 in | Wt 264.0 lb

## 2019-09-06 DIAGNOSIS — R12 Heartburn: Secondary | ICD-10-CM

## 2019-09-06 DIAGNOSIS — R197 Diarrhea, unspecified: Secondary | ICD-10-CM

## 2019-09-06 DIAGNOSIS — Z1211 Encounter for screening for malignant neoplasm of colon: Secondary | ICD-10-CM

## 2019-09-06 NOTE — Progress Notes (Signed)
No egg or soy allergy known to patient  No issues with past sedation with any surgeries or procedures No intubation problems in the past  No diet pills per patient No home 02 use per patient  No blood thinners per patient  Pt denies issues with constipation  No A fib or A flutter  EMMI video to [place] 19 guidelines implemented in PV today   COVID vaccines completed on 04/2019 per pt;   Due to the COVID-19 pandemic we are asking patients to follow these guidelines. Please only bring one care partner. Please be aware that your care partner may wait in the car in the parking lot or if they feel like they will be too hot to wait in the car, they may wait in the lobby on the 4th floor. All care partners are required to wear a mask the entire time (we do not have any that we can provide them), they need to practice social distancing, and we will do a Covid check for all patient's and care partners when you arrive. Also we will check their temperature and your temperature. If the care partner waits in their car they need to stay in the parking lot the entire time and we will call them on their cell phone when the patient is ready for discharge so they can bring the car to the front of the building. Also all patient's will need to wear a mask into building.

## 2019-09-12 NOTE — Progress Notes (Signed)
Cardiology Office Note:    Date:  09/13/2019   ID:  Travis Newman, DOB 02/19/1945, MRN [id number]  PCP:  Delorise Shiner, MD  Cardiologist:  Shirlee More, MD    Referring MD: Delorise Shiner, MD    ASSESSMENT:    1. High coronary artery calcium score   2. Mild CAD   3. Type 2 diabetes mellitus without complication, with long-term current use of insulin ([place])   4. Mixed hyperlipidemia    PLAN:    In order of problems listed above:  1. He has multiple cardiovascular risk including his diabetes hyperlipidemia agent orange exposure has a high total calcium score and percentile score but fortunately has only mild CAD and if present is not having angina.  He will restart his aspirin 81 mg daily continue beta-blocker and lipid-lowering with a high intensity statin.  Goal LDL is less than 70.  I gave him a prescription for nitroglycerin as these individuals at times can have angina.  I will plan to see him back in the office in 1 year in follow-up. 2. Poorly controlled being managed by his endocrinologist at the [facility] and his intensifying insulin treatment.  If another agent is needed SGLT2 inhibitors would be appropriate for cardiovascular benefit 3. Continue high intensity statin   Next appointment: 1 year   Medication Adjustments/Labs and Tests Ordered: Current medicines are reviewed at length with the patient today.  Concerns regarding medicines are outlined above.  No orders of the defined types were placed in this encounter.  No orders of the defined types were placed in this encounter.   Chief Complaint  Patient presents with   Follow-up    After cardiac CTA    History of Present Illness:    Travis Newman is a 74 y.o. male with a hx of high coronary calcium score 332, agent orange exposure and type 2 diabetes COVID-19 infection, edema shortness of breath and a history of remote SVT   last seen 07/19/2019. Compliance with diet, lifestyle and medications: Yes  I  reviewed his cardiac CTA with the patient. He is having no angina dyspnea palpitation or syncope he is on appropriate medical therapy will be sure that he is taking low-dose aspirin along with his beta-blocker diabetic agents and high intensity statin.  He is frustrated his diabetes out-of-control A1c greater than 10 and questions whether having COVID-19 or receiving vaccine is affected diabetic control his endocrinologist told him though not sure his intensified insulin treatment.  Subsequent coronary CTA shows very mild nonobstructive CAD: IMPRESSION: 1. Minimal non-obstructive mixed CAD, CADRADS = 1 to 24% left anterior descending second diagonal branch left circumflex right coronary artery and borderline to very mild dilation of the aorta 37 mm..  2. Coronary calcium score of 337. This was 64th percentile for age and sex matched control.  3. Normal coronary origin with right dominance.  4. Aortic atherosclerosis.  5. Dilated main pulmonary artery to 33 mm, suggestive of pulmonary hypertension. The ascending aorta measured normal. 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   finasteride (PROSCAR) 5 MG tablet Take 5 mg by mouth daily.   gabapentin (NEURONTIN) 300 MG capsule Take 300 mg by mouth 3 (three) times daily.   insulin aspart (NOVOLOG) 100 UNIT/ML injection Inject 18 Units into the skin 3 (three) times daily before meals.   Insulin Glargine (LANTUS [place]) Inject into the skin at bedtime.   metFORMIN (GLUCOPHAGE) 500 MG tablet Take 500 mg by mouth 2 (two) times daily with a meal.   metoprolol tartrate (LOPRESSOR) 25 MG tablet Take 25 mg by mouth daily.   omeprazole (PRILOSEC) 20 MG capsule Take 20 mg by mouth daily as needed. Acid reflux   rosuvastatin (CRESTOR) 10 MG tablet Take 1 tablet (10 mg total) by mouth daily.   zolpidem (AMBIEN) 10 MG tablet Take 10 mg by mouth at bedtime as needed for sleep.     Allergies:   Penicillins, Lidocaine-epinephrine, and Clindamycin hcl   Social History   Socioeconomic History   Marital status: Married    Spouse name: Not on file   Number of children: Not on file   Years of education: Not on file   Highest education level: Not on file  Occupational History   Not on file  Tobacco Use   Smoking status: Former Smoker    Types: Cigars    Quit date: 49    Years since quitting: 36.6   Smokeless tobacco: Former Counsellor Use: Never used  Substance and Sexual Activity   Alcohol use: Never   Drug use: Never   Sexual activity: Not Currently  Other Topics Concern   Not on file  Social History Narrative   Not on file   Social Determinants of Health   Financial Resource Strain:    Difficulty of Paying Living Expenses:   Food Insecurity:    Worried About Charity fundraiser in the Last Year:    Arboriculturist in the Last Year:   Transportation Needs:    Film/video editor (Medical):    Lack of Transportation (Non-Medical):   Physical Activity:    Days of  Exercise per Week:    Minutes of Exercise per Session:   Stress:    Feeling of Stress :   Social Connections:    Frequency of Communication with Friends and Family:    Frequency of Social Gatherings with Friends and Family:    Attends Religious Services:    Active Member of Clubs or Organizations:    Attends Archivist Meetings:    Marital Status:      Family History: The patient's family history includes Diabetes in his mother; Stroke in his father. There is no history of Colon polyps, Colon cancer, Esophageal cancer, Rectal cancer, or Stomach cancer. ROS:   Please see the history of present illness.    All other systems reviewed and are negative.  EKGs/Labs/Other Studies Reviewed:    The following studies were reviewed today:  Recent Labs: 03/09/2019: NT-Pro BNP 43 08/10/2019: BUN 14; Creatinine, Ser 0.87; Potassium 4.1; Sodium 137  Recent Lipid Panel No results found for: CHOL, TRIG, HDL, CHOLHDL, VLDL, LDLCALC, LDLDIRECT  Physical Exam:    VS:  BP 128/68    Pulse 69    Ht 6\' 3"  (1.905 m)    Wt 265 lb 12.8 oz (120.6 kg)    SpO2 97%    BMI 33.22 kg/m     Wt Readings from Last 3 Encounters:  09/13/19 265 lb 12.8 oz (120.6 kg)  09/06/19 (!) 264 lb (119.7 kg)  07/19/19 266 lb 3.2 oz (120.7 kg)     GEN:  Well nourished, well developed in no acute distress HEENT: Normal NECK: No JVD; No carotid bruits LYMPHATICS: No lymphadenopathy CARDIAC: RRR, no murmurs, rubs, gallops RESPIRATORY:  Clear to auscultation without rales, wheezing or rhonchi  ABDOMEN: Soft, non-tender, non-distended MUSCULOSKELETAL:  No edema; No deformity  SKIN: Warm and dry NEUROLOGIC:  Alert and oriented x 3 PSYCHIATRIC:  Normal affect    Signed, Shirlee More, MD  09/13/2019 8:25 AM    [place]

## 2019-09-13 ENCOUNTER — Ambulatory Visit (INDEPENDENT_AMBULATORY_CARE_PROVIDER_SITE_OTHER): Payer: Medicare Other | Admitting: Cardiology

## 2019-09-13 ENCOUNTER — Encounter: Payer: Self-pay | Admitting: Cardiology

## 2019-09-13 ENCOUNTER — Other Ambulatory Visit: Payer: Self-pay

## 2019-09-13 VITALS — BP 128/68 | HR 69 | Ht 75.0 in | Wt 265.8 lb

## 2019-09-13 DIAGNOSIS — R931 Abnormal findings on diagnostic imaging of heart and coronary circulation: Secondary | ICD-10-CM | POA: Diagnosis not present

## 2019-09-13 DIAGNOSIS — Z794 Long term (current) use of insulin: Secondary | ICD-10-CM

## 2019-09-13 DIAGNOSIS — E119 Type 2 diabetes mellitus without complications: Secondary | ICD-10-CM | POA: Diagnosis not present

## 2019-09-13 DIAGNOSIS — E782 Mixed hyperlipidemia: Secondary | ICD-10-CM | POA: Diagnosis not present

## 2019-09-13 DIAGNOSIS — I2510 Atherosclerotic heart disease of native coronary artery without angina pectoris: Secondary | ICD-10-CM | POA: Diagnosis not present

## 2019-09-13 MED ORDER — NITROGLYCERIN 0.4 MG SL SUBL
0.4000 mg | SUBLINGUAL_TABLET | SUBLINGUAL | 3 refills | Status: AC | PRN
Start: 2019-09-13 — End: 2021-10-26

## 2019-09-13 NOTE — Patient Instructions (Signed)
Medication Instructions:  Your physician has recommended you make the following change in your medication:  START: Nitroglycerine 0.4 mg take one tablet by mouth as needed for chest pain.   Please start taking your Aspirin again.   *If you need a refill on your cardiac medications before your next appointment, please call your pharmacy*   Lab Work: None If you have labs (blood work) drawn today and your tests are completely normal, you will receive your results only by: Marland Kitchen MyChart Message (if you have MyChart) OR . A paper copy in the mail If you have any lab test that is abnormal or we need to change your treatment, we will call you to review the results.   Testing/Procedures: None   Follow-Up: At [facility], you and your health needs are our priority.  As part of our continuing mission to provide you with exceptional heart care, we have created designated Provider Care Teams.  These Care Teams include your primary Cardiologist (physician) and Advanced Practice Providers (APPs -  Physician Assistants and Nurse Practitioners) who all work together to provide you with the care you need, when you need it.  We recommend signing up for the patient portal called "MyChart".  Sign up information is provided on this After Visit Summary.  MyChart is used to connect with patients for Virtual Visits (Telemedicine).  Patients are able to view lab/test results, encounter notes, upcoming appointments, etc.  Non-urgent messages can be sent to your provider as well.   To learn more about what you can do with MyChart, go to [email or web address].    Your next appointment:   1 year(s)  The format for your next appointment:   In Person  Provider:   Shirlee More, MD   Other Instructions

## 2019-10-02 ENCOUNTER — Encounter: Payer: Self-pay | Admitting: Gastroenterology

## 2019-10-02 ENCOUNTER — Ambulatory Visit (AMBULATORY_SURGERY_CENTER): Payer: No Typology Code available for payment source | Admitting: Gastroenterology

## 2019-10-02 ENCOUNTER — Other Ambulatory Visit: Payer: Self-pay

## 2019-10-02 VITALS — BP 105/57 | HR 68 | Temp 97.9°F | Resp 14 | Ht 75.0 in | Wt 264.0 lb

## 2019-10-02 DIAGNOSIS — Z860100 Personal history of colon polyps, unspecified: Secondary | ICD-10-CM

## 2019-10-02 DIAGNOSIS — D126 Benign neoplasm of colon, unspecified: Secondary | ICD-10-CM

## 2019-10-02 DIAGNOSIS — D124 Benign neoplasm of descending colon: Secondary | ICD-10-CM | POA: Diagnosis not present

## 2019-10-02 DIAGNOSIS — D123 Benign neoplasm of transverse colon: Secondary | ICD-10-CM | POA: Diagnosis not present

## 2019-10-02 DIAGNOSIS — D120 Benign neoplasm of cecum: Secondary | ICD-10-CM | POA: Diagnosis not present

## 2019-10-02 MED ORDER — SODIUM CHLORIDE 0.9 % IV SOLN
500.0000 mL | Freq: Once | INTRAVENOUS | Status: DC
Start: 2019-10-02 — End: 2019-10-02

## 2019-10-02 NOTE — Progress Notes (Signed)
Pt's states no medical or surgical changes since previsit or office visit. VS by CW. 

## 2019-10-02 NOTE — Progress Notes (Addendum)
Called to room to assist during endoscopic procedure.  Patient ID and intended procedure confirmed with present staff. Received instructions for my participation in the procedure from the performing physician.   Dr. Lyndel Safe was made aware we did not retrieve descending colon polyp x1 cs.MAW

## 2019-10-02 NOTE — Op Note (Signed)
[place] [facility] Patient Name: Travis Newman Procedure Date: 10/02/2019 7:29 AM MRN: [id number] Endoscopist: Jackquline Denmark , MD Age: 74 Referring MD:  Date of Birth: 08-31-1945 Gender: Male Account #: [id number] Procedure:                Colonoscopy Indications:              High risk colon cancer surveillance: Personal                            history of colonic polyps. FH of colon cancer GF. Medicines:                Monitored Anesthesia Care Procedure:                Pre-Anesthesia Assessment:                           - Prior to the procedure, a History and Physical                            was performed, and patient medications and                            allergies were reviewed. The patient's tolerance of                            previous anesthesia was also reviewed. The risks                            and benefits of the procedure and the sedation                            options and risks were discussed with the patient.                            All questions were answered, and informed consent                            was obtained. Prior Anticoagulants: The patient has                            taken no previous anticoagulant or antiplatelet                            agents. ASA Grade Assessment: III - A patient with                            severe systemic disease. After reviewing the risks                            and benefits, the patient was deemed in                            satisfactory condition to undergo the procedure.  After obtaining informed consent, the colonoscope                            was passed under direct vision. Throughout the                            procedure, the patient's blood pressure, pulse, and                            oxygen saturations were monitored continuously. The                            Colonoscope was introduced through the anus and                            advanced to the  the cecum, identified by                            appendiceal orifice and ileocecal valve. The                            colonoscopy was performed without difficulty. The                            patient tolerated the procedure well. The quality                            of the bowel preparation was good. The ileocecal                            valve, appendiceal orifice, and rectum were                            photographed. Scope In: 8:10:48 AM Scope Out: 8:33:58 AM Scope Withdrawal Time: 0 hours 19 minutes 49 seconds  Total Procedure Duration: 0 hours 23 minutes 10 seconds  Findings:                 Two sessile polyps were found in the cecum. The                            polyps were 2 to 4 mm in size. These polyps were                            removed with a cold biopsy forceps. Resection and                            retrieval were complete.                           Three sessile polyps were found in the hepatic                            flexure. The polyps were 6 to 10 mm in size.  2                            larger polyps were removed with a hot snare,                            smaller with cold snare. Resection and retrieval                            were complete.                           Five sessile polyps were found in the proximal                            descending colon and proximal transverse colon. The                            polyps were 6 to 12 mm in size. 4 larger polyps                            were removed with a hot snare, one smaller with                            cold snare. Resection and retrieval were complete.                           A few small-mouthed diverticula were found in the                            sigmoid colon.                           Non-bleeding internal hemorrhoids were found during                            retroflexion. The hemorrhoids were small.                           The exam was otherwise without  abnormality on                            direct and retroflexion views. Complications:            No immediate complications. Estimated Blood Loss:     Estimated blood loss: none. Impression:               - Colonic polyps s/p polypectomy.                           - Mild sigmoid diverticulosis.                           - Non-bleeding internal hemorrhoids.                           -  The examination was otherwise normal on direct                            and retroflexion views. Recommendation:           - Patient has a contact number available for                            emergencies. The signs and symptoms of potential                            delayed complications were discussed with the                            patient. Return to normal activities tomorrow.                            Written discharge instructions were provided to the                            patient.                           - Resume previous diet.                           - Continue present medications.                           - Await pathology results.                           - No ibuprofen, naproxen, or other non-steroidal                            anti-inflammatory drugs for 5 days after polyp                            removal.                           - Repeat colonoscopy for surveillance based on                            pathology results.                           - The findings and recommendations were discussed                            with the patient's wife Violet. Jackquline Denmark, MD 10/02/2019 8:43:55 AM This report has been signed electronically.

## 2019-10-02 NOTE — Patient Instructions (Signed)
NO ASPIRIN, ASPIRIN CONTAINING PRODUCTS (BC OR GOODY POWDERS) OR NSAIDS (IBUPROFEN, ADVIL, ALEVE, AND MOTRIN) FOR 5 days after polyp removal; TYLENOL IS OK TO TAKE  Information on polyps ,diverticulosis,& hemorrhoids given to you today  Await pathology results   YOU HAD AN ENDOSCOPIC PROCEDURE TODAY AT [place]:   Refer to the procedure report that was given to you for any specific questions about what was found during the examination.  If the procedure report does not answer your questions, please call your gastroenterologist to clarify.  If you requested that your care partner not be given the details of your procedure findings, then the procedure report has been included in a sealed envelope for you to review at your convenience later.  YOU SHOULD EXPECT: Some feelings of bloating in the abdomen. Passage of more gas than usual.  Walking can help get rid of the air that was put into your GI tract during the procedure and reduce the bloating. If you had a lower endoscopy (such as a colonoscopy or flexible sigmoidoscopy) you may notice spotting of blood in your stool or on the toilet paper. If you underwent a bowel prep for your procedure, you may not have a normal bowel movement for a few days.  Please Note:  You might notice some irritation and congestion in your nose or some drainage.  This is from the oxygen used during your procedure.  There is no need for concern and it should clear up in a day or so.  SYMPTOMS TO REPORT IMMEDIATELY:   Following lower endoscopy (colonoscopy or flexible sigmoidoscopy):  Excessive amounts of blood in the stool  Significant tenderness or worsening of abdominal pains  Swelling of the abdomen that is new, acute  Fever of 100F or higher    For urgent or emergent issues, a gastroenterologist can be reached at any hour by calling [phone]. Do not use MyChart messaging for urgent concerns.    DIET:  We do recommend a small meal  at first, but then you may proceed to your regular diet.  Drink plenty of fluids but you should avoid alcoholic beverages for 24 hours.  ACTIVITY:  You should plan to take it easy for the rest of today and you should NOT DRIVE or use heavy machinery until tomorrow (because of the sedation medicines used during the test).    FOLLOW UP: Our staff will call the number listed on your records 48-72 hours following your procedure to check on you and address any questions or concerns that you may have regarding the information given to you following your procedure. If we do not reach you, we will leave a message.  We will attempt to reach you two times.  During this call, we will ask if you have developed any symptoms of COVID 19. If you develop any symptoms (ie: fever, flu-like symptoms, shortness of breath, cough etc.) before then, please call [phone].  If you test positive for Covid 19 in the 2 weeks post procedure, please call and report this information to [place].    If any biopsies were taken you will be contacted by phone or by letter within the next 1-3 weeks.  Please call us at [phone] if you have not heard about the biopsies in 3 weeks.    SIGNATURES/CONFIDENTIALITY: You and/or your care partner have signed paperwork which will be entered into your electronic medical record.  These signatures attest to the fact that that the information above  on your After Visit Summary has been reviewed and is understood.  Full responsibility of the confidentiality of this discharge information lies with you and/or your care-partner.

## 2019-10-02 NOTE — Progress Notes (Signed)
pt tolerated well. VSS. awake and to recovery. Report given to RN.  

## 2019-10-04 ENCOUNTER — Telehealth: Payer: Self-pay

## 2019-10-04 NOTE — Telephone Encounter (Signed)
  Follow up Call-  Call back number 10/02/2019  Post procedure Call Back phone  # [phone]  Permission to leave phone message Yes  Some recent data might be hidden     Patient questions:  Do you have a fever, pain , or abdominal swelling? No. Pain Score  0 *  Have you tolerated food without any problems? Yes.    Have you been able to return to your normal activities? Yes.    Do you have any questions about your discharge instructions: Diet   No. Medications  No. Follow up visit  No.  Do you have questions or concerns about your Care? No.  Actions: * If pain score is 4 or above: No action needed, pain <4.   1. Have you developed a fever since your procedure? No   2.   Have you had an respiratory symptoms (SOB or cough) since your procedure? No   3.   Have you tested positive for COVID 19 since your procedure? No   4.   Have you had any family members/close contacts diagnosed with the COVID 19 since your procedure?  No    If yes to any of these questions please route to Joylene John, RN and Joella Prince, RN

## 2019-10-07 ENCOUNTER — Encounter: Payer: Self-pay | Admitting: Gastroenterology

## 2019-10-18 DIAGNOSIS — Z20822 Contact with and (suspected) exposure to covid-19: Secondary | ICD-10-CM | POA: Diagnosis not present

## 2019-10-18 DIAGNOSIS — R05 Cough: Secondary | ICD-10-CM | POA: Diagnosis not present

## 2019-11-20 ENCOUNTER — Telehealth: Payer: Self-pay | Admitting: Cardiology

## 2019-11-20 NOTE — Telephone Encounter (Signed)
[facility] needs the patient's records sent to them. Their fax # is [phone]

## 2019-11-20 NOTE — Telephone Encounter (Signed)
Records faxed at this time

## 2020-01-28 DIAGNOSIS — L820 Inflamed seborrheic keratosis: Secondary | ICD-10-CM | POA: Diagnosis not present

## 2020-01-28 DIAGNOSIS — L578 Other skin changes due to chronic exposure to nonionizing radiation: Secondary | ICD-10-CM | POA: Diagnosis not present

## 2020-01-28 DIAGNOSIS — L570 Actinic keratosis: Secondary | ICD-10-CM | POA: Diagnosis not present

## 2020-01-28 DIAGNOSIS — L821 Other seborrheic keratosis: Secondary | ICD-10-CM | POA: Diagnosis not present

## 2020-02-05 IMAGING — CT CT ANGIO CHEST
2 of 8 series · 19 of 36 positions shown · IV contrast (OMNI)
Comparison: Chest two-view 08/23/2017

CLINICAL DATA: Chest pressure dizziness palpitations. Rule out
pulmonary embolism. Left knee replacement 1 week ago.

EXAM:
CT ANGIOGRAPHY CHEST WITH CONTRAST
TECHNIQUE: Multidetector CT imaging of the chest was performed using the
standard protocol during bolus administration of intravenous
contrast. Multiplanar CT image reconstructions and MIPs were
obtained to evaluate the vascular anatomy.
CONTRAST:  100mL GINB7M-XNC IOPAMIDOL (GINB7M-XNC) INJECTION 76%

[Series 6: thins · axial · 0.83mm/px · z∈[+1078,+1350]mm · 18 of 304 slices shown]
[im 16/304  lung]
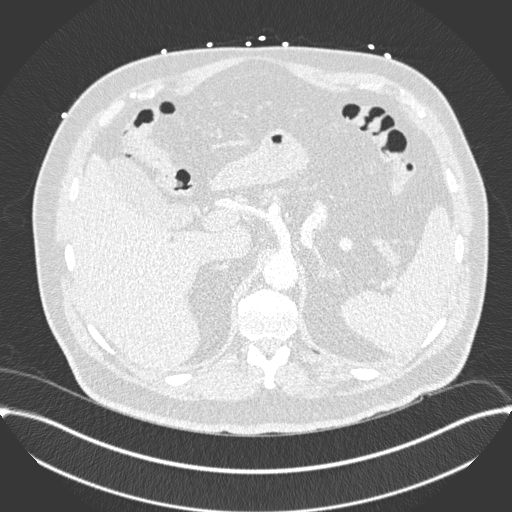
[im 32/304  mediastinal]
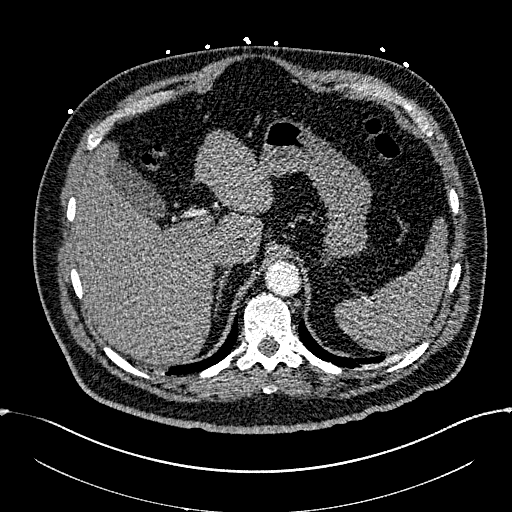
[im 48/304  lung]
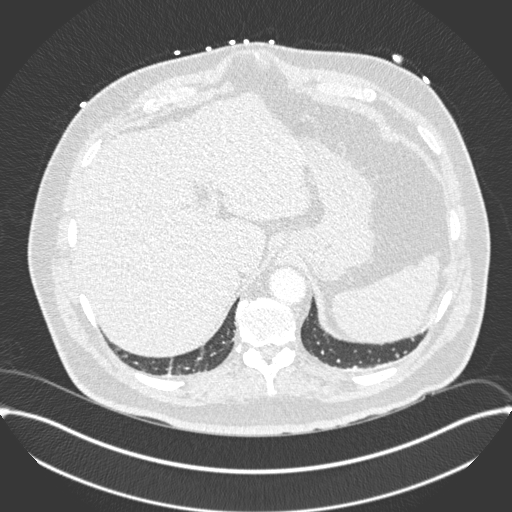
[im 64/304  mediastinal]
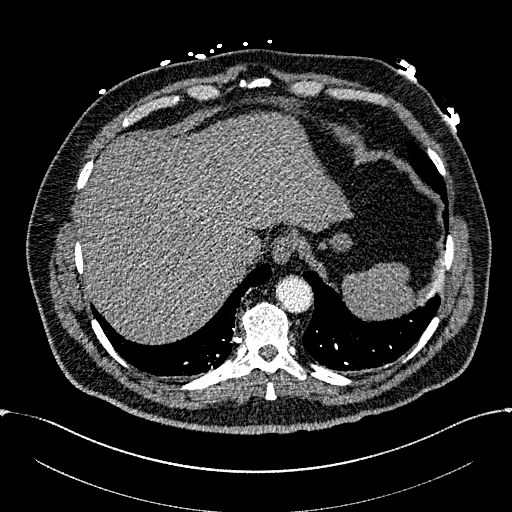
[im 80/304  lung]
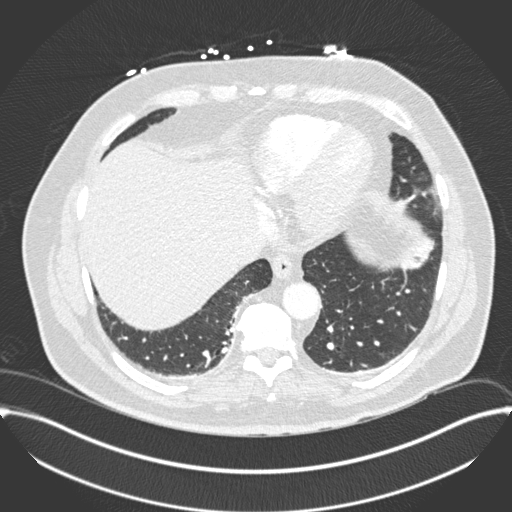
[im 96/304  mediastinal]
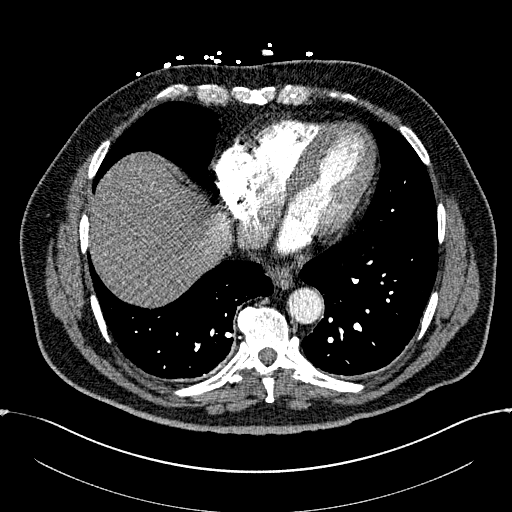
[im 112/304  lung]
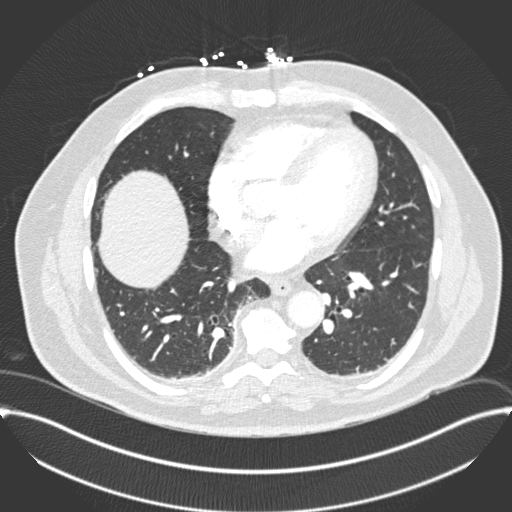
[im 128/304  mediastinal]
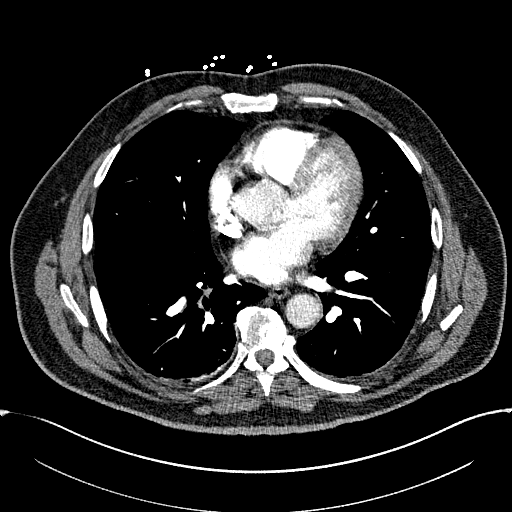
[im 144/304  lung]
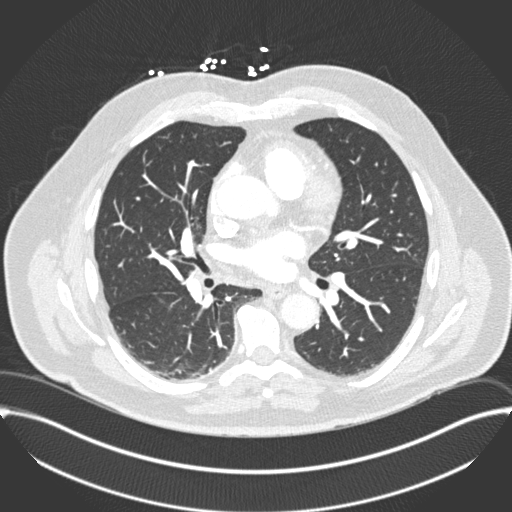
[im 160/304  mediastinal]
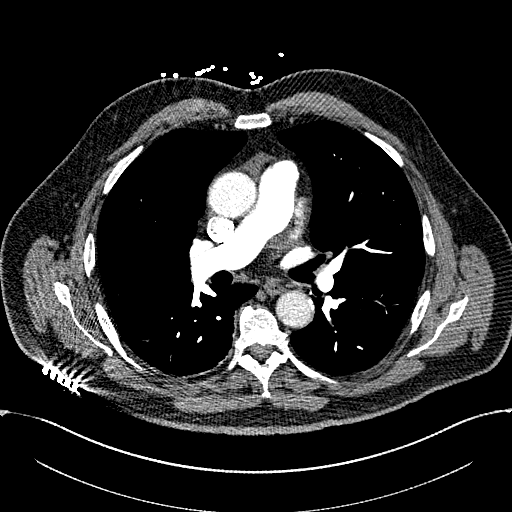
[im 176/304  lung]
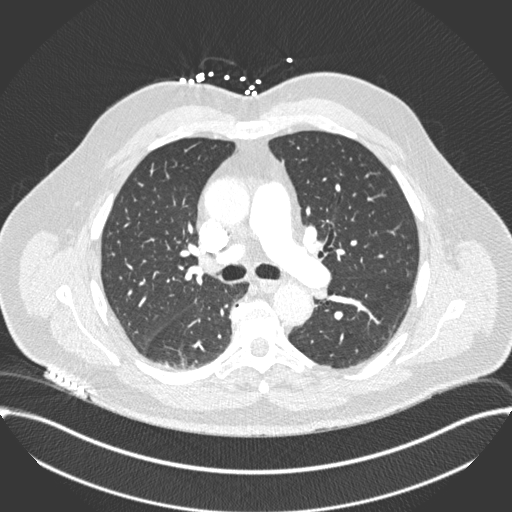
[im 192/304  mediastinal]
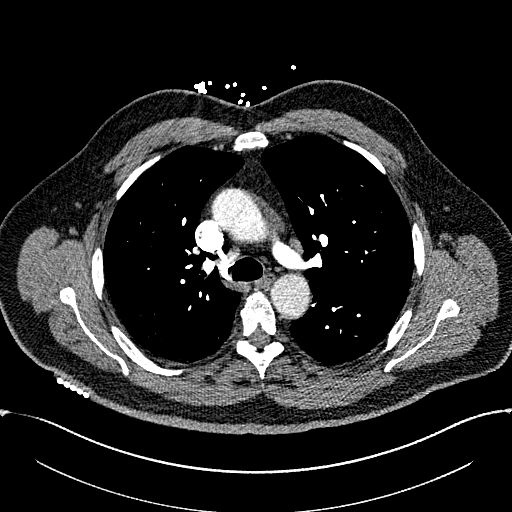
[im 208/304  lung]
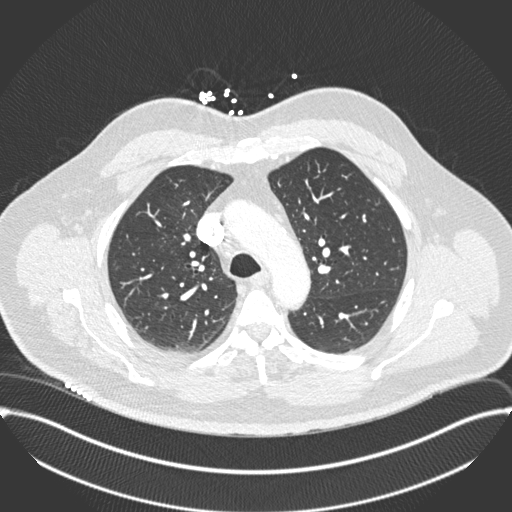
[im 224/304  mediastinal]
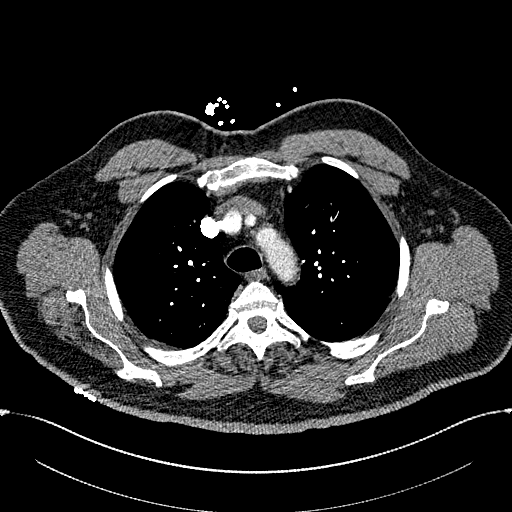
[im 240/304  lung]
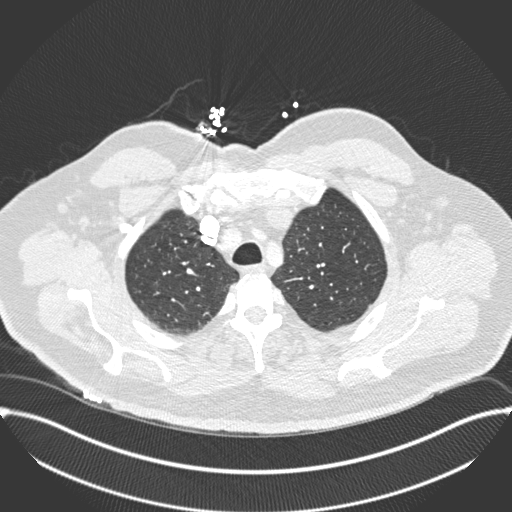
[im 256/304  mediastinal]
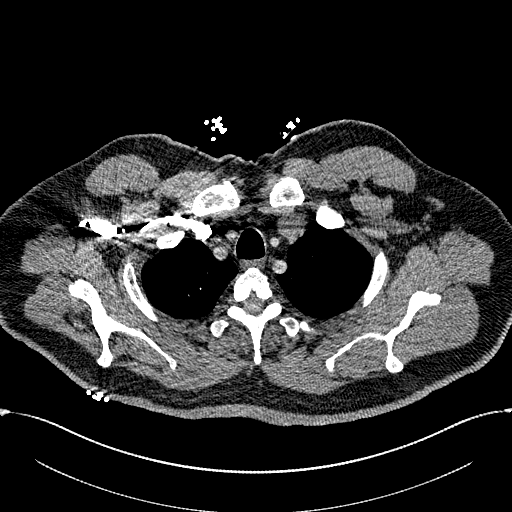
[im 272/304  lung]
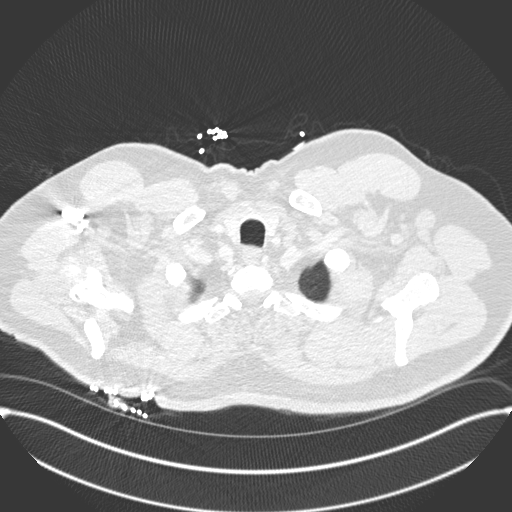
[im 288/304  mediastinal]
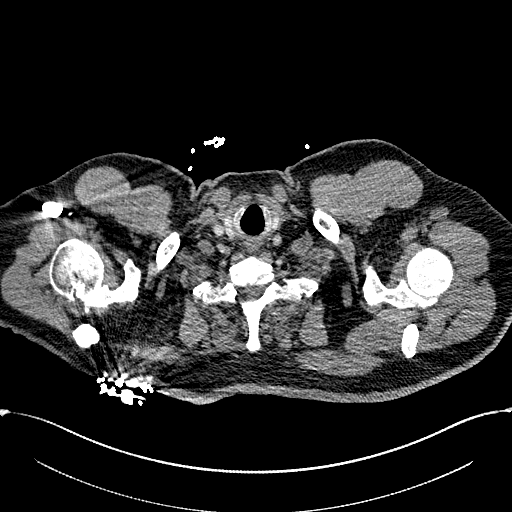

[Series 8: coronal mpr · coronal · 0.59mm/px · 1 of 166 slices shown]
[im 83/166  mediastinal]
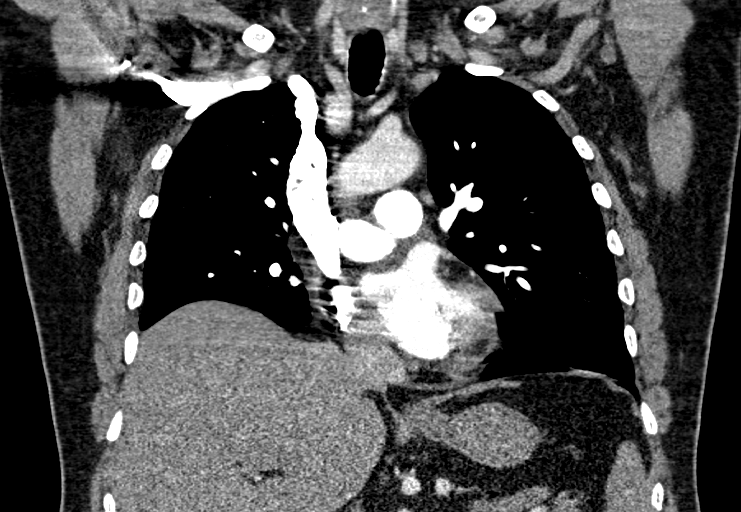

[19 of 36 positions shown; findings below may reference images not displayed]

FINDINGS: Cardiovascular: Negative for pulmonary embolism. Pulmonary arteries
normal in caliber. Mild atherosclerotic disease in the aortic arch
without aneurysm or dissection. Mild coronary artery calcification.
Heart size normal. No pericardial effusion.

Mediastinum/Nodes: Negative for mass or adenopathy

Lungs/Pleura: Small 4 mm nodular densities along the major fissure
on the left and along the minor fissure on the right. Probable
benign lymph nodes. No lung parenchymal nodules. No infiltrate
effusion or mass.

Upper Abdomen: Negative

Musculoskeletal: Negative

Review of the MIP images confirms the above findings.
IMPRESSION: Negative for pulmonary embolism.  No acute abnormality in the chest

Small nodules along the fissures most consistent with benign lymph
nodes.

Mild coronary artery calcification

Aortic Atherosclerosis (YHEA2-HK0.0).

## 2020-02-11 DIAGNOSIS — E1165 Type 2 diabetes mellitus with hyperglycemia: Secondary | ICD-10-CM | POA: Diagnosis not present

## 2020-02-11 DIAGNOSIS — Z139 Encounter for screening, unspecified: Secondary | ICD-10-CM | POA: Diagnosis not present

## 2020-02-11 DIAGNOSIS — Z1331 Encounter for screening for depression: Secondary | ICD-10-CM | POA: Diagnosis not present

## 2020-02-11 DIAGNOSIS — Z9181 History of falling: Secondary | ICD-10-CM | POA: Diagnosis not present

## 2020-02-22 DIAGNOSIS — Z6833 Body mass index (BMI) 33.0-33.9, adult: Secondary | ICD-10-CM | POA: Diagnosis not present

## 2020-02-22 DIAGNOSIS — E1165 Type 2 diabetes mellitus with hyperglycemia: Secondary | ICD-10-CM | POA: Diagnosis not present

## 2020-05-27 DIAGNOSIS — J069 Acute upper respiratory infection, unspecified: Secondary | ICD-10-CM | POA: Diagnosis not present

## 2020-05-27 DIAGNOSIS — N390 Urinary tract infection, site not specified: Secondary | ICD-10-CM | POA: Diagnosis not present

## 2020-05-27 DIAGNOSIS — R300 Dysuria: Secondary | ICD-10-CM | POA: Diagnosis not present

## 2020-05-27 DIAGNOSIS — K4090 Unilateral inguinal hernia, without obstruction or gangrene, not specified as recurrent: Secondary | ICD-10-CM | POA: Diagnosis not present

## 2020-05-27 DIAGNOSIS — M47816 Spondylosis without myelopathy or radiculopathy, lumbar region: Secondary | ICD-10-CM | POA: Diagnosis not present

## 2020-05-27 DIAGNOSIS — N200 Calculus of kidney: Secondary | ICD-10-CM | POA: Diagnosis not present

## 2020-05-27 DIAGNOSIS — B9689 Other specified bacterial agents as the cause of diseases classified elsewhere: Secondary | ICD-10-CM | POA: Diagnosis not present

## 2020-05-27 DIAGNOSIS — F17210 Nicotine dependence, cigarettes, uncomplicated: Secondary | ICD-10-CM | POA: Diagnosis not present

## 2020-06-06 DIAGNOSIS — B3749 Other urogenital candidiasis: Secondary | ICD-10-CM | POA: Diagnosis not present

## 2020-06-06 DIAGNOSIS — Z6831 Body mass index (BMI) 31.0-31.9, adult: Secondary | ICD-10-CM | POA: Diagnosis not present

## 2020-07-28 DIAGNOSIS — L578 Other skin changes due to chronic exposure to nonionizing radiation: Secondary | ICD-10-CM | POA: Diagnosis not present

## 2020-07-28 DIAGNOSIS — L820 Inflamed seborrheic keratosis: Secondary | ICD-10-CM | POA: Diagnosis not present

## 2020-07-28 DIAGNOSIS — L821 Other seborrheic keratosis: Secondary | ICD-10-CM | POA: Diagnosis not present

## 2020-08-28 DIAGNOSIS — R000 Tachycardia, unspecified: Secondary | ICD-10-CM | POA: Insufficient documentation

## 2020-08-28 DIAGNOSIS — M1990 Unspecified osteoarthritis, unspecified site: Secondary | ICD-10-CM | POA: Insufficient documentation

## 2020-08-28 DIAGNOSIS — G4730 Sleep apnea, unspecified: Secondary | ICD-10-CM | POA: Insufficient documentation

## 2020-08-28 DIAGNOSIS — J449 Chronic obstructive pulmonary disease, unspecified: Secondary | ICD-10-CM | POA: Insufficient documentation

## 2020-09-14 NOTE — Progress Notes (Signed)
Lipid Cardiology Office Note:    Date:  09/15/2020   ID:  Travis Newman, DOB Oct 18, 1945, MRN [id number]  PCP:  Delorise Shiner, MD  Cardiologist:  Shirlee More, MD    Referring MD: Delorise Shiner, MD    ASSESSMENT:    1. Mild CAD   2. High coronary artery calcium score   3. Mixed hyperlipidemia    PLAN:    In order of problems listed above:  Stable now that he requires a repeat ischemia evaluation he is having no angina on current medical treatment we will continue his beta-blocker and high intensity statin.  If not on aspirin I will ask him to start 81 mg daily. Continue statin check lipid profile he is having no muscle pain or weakness   Next appointment: 1 year   Medication Adjustments/Labs and Tests Ordered: Current medicines are reviewed at length with the patient today.  Concerns regarding medicines are outlined above.  Orders Placed This Encounter  Procedures   Lipid panel   EKG 12-Lead    No orders of the defined types were placed in this encounter.   Chief Complaint  Patient presents with   Follow-up   Coronary Artery Disease     History of Present Illness:    Travis Newman is a 75 y.o. male with a hx of multiple cardiovascular risk including type 2 diabetes anemia Agent Orange exposure who had cardiac CTA performed showing a calcium score 337 and minimal nonobstructive CAD last seen 09/13/2019  Compliance with diet, lifestyle and medications: Yes  Is struggling with his diabetes after COVID and is finally achieving target numbers again. No angina dyspnea palpitation or syncope tolerates lipid-lowering not sure any Healthcare system has recently checked his lipids and we will do a lipid profile today with his high intensity statin.  He has no muscle pain or weakness.  [facility] A1c elevated 8.7 Platelets diminished 130,000 Creatinine BUN normal 0.84 and 17. 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   ezetimibe (ZETIA) 10 MG tablet Take 10 mg by mouth daily.   finasteride (PROSCAR) 5 MG tablet Take 5 mg by mouth daily.   gabapentin (NEURONTIN) 300 MG capsule Take 300 mg by mouth 3 (three) times daily.   insulin aspart (NOVOLOG) 100 UNIT/ML injection Inject 18 Units into the skin 3 (three) times daily before meals.   Insulin Glargine (LANTUS [place]) Inject into the skin at bedtime.   metoprolol tartrate (LOPRESSOR) 25 MG tablet Take  25 mg by mouth daily.   nitroGLYCERIN (NITROSTAT) 0.4 MG SL tablet Place 1 tablet (0.4 mg total) under the tongue every 5 (five) minutes as needed for chest pain.   omeprazole (PRILOSEC) 20 MG capsule Take 20 mg by mouth daily as needed. Acid reflux   rosuvastatin (CRESTOR) 10 MG tablet Take 1 tablet (10 mg total) by mouth daily.   zolpidem (AMBIEN) 10 MG tablet Take 10 mg by mouth at bedtime as needed for sleep.     Allergies:   Penicillins, Atorvastatin, Lidocaine-epinephrine, Pravastatin, and Clindamycin hcl   Social History   Socioeconomic History   Marital status: Married     Spouse name: Not on file   Number of children: Not on file   Years of education: Not on file   Highest education level: Not on file  Occupational History   Not on file  Tobacco Use   Smoking status: Former    Types: Cigars    Quit date: 71    Years since quitting: 37.6   Smokeless tobacco: Former  Scientific laboratory technician Use: Never used  Substance and Sexual Activity   Alcohol use: Never   Drug use: Never   Sexual activity: Not Currently  Other Topics Concern   Not on file  Social History Narrative   Not on file   Social Determinants of Health   Financial Resource Strain: Not on file  Food Insecurity: Not on file  Transportation Needs: Not on file  Physical Activity: Not on file  Stress: Not on file  Social Connections: Not on file     Family History: The patient's family history includes Diabetes in his mother; Stroke in his father. There is no history of Colon polyps, Colon cancer, Esophageal cancer, Rectal cancer, or Stomach cancer. ROS:   Please see the history of present illness.    All other systems reviewed and are negative.  EKGs/Labs/Other Studies Reviewed:    The following studies were reviewed today:  EKG:  EKG ordered today and personally reviewed.  The ekg ordered today demonstrates sinus rhythm left anterior hemiblock otherwise normal   Physical Exam:    VS:  BP 114/68   Pulse 82   Ht '6\' 2"'$  (1.88 m)   Wt 258 lb 3.2 oz (117.1 kg)   SpO2 95%   BMI 33.15 kg/m     Wt Readings from Last 3 Encounters:  09/15/20 258 lb 3.2 oz (117.1 kg)  10/02/19 264 lb (119.7 kg)  09/13/19 265 lb 12.8 oz (120.6 kg)     GEN:  Well nourished, well developed in no acute distress HEENT: Normal NECK: No JVD; No carotid bruits LYMPHATICS: No lymphadenopathy CARDIAC: RRR, no murmurs, rubs, gallops RESPIRATORY:  Clear to auscultation without rales, wheezing or rhonchi  ABDOMEN: Soft, non-tender, non-distended MUSCULOSKELETAL:  No edema; No deformity  SKIN: Warm  and dry NEUROLOGIC:  Alert and oriented x 3 PSYCHIATRIC:  Normal affect    Signed, Shirlee More, MD  09/15/2020 9:51 AM    [place]

## 2020-09-15 ENCOUNTER — Encounter: Payer: Self-pay | Admitting: Cardiology

## 2020-09-15 ENCOUNTER — Ambulatory Visit (INDEPENDENT_AMBULATORY_CARE_PROVIDER_SITE_OTHER): Payer: Medicare Other | Admitting: Cardiology

## 2020-09-15 ENCOUNTER — Other Ambulatory Visit: Payer: Self-pay

## 2020-09-15 VITALS — BP 114/68 | HR 82 | Ht 74.0 in | Wt 258.2 lb

## 2020-09-15 DIAGNOSIS — R931 Abnormal findings on diagnostic imaging of heart and coronary circulation: Secondary | ICD-10-CM | POA: Diagnosis not present

## 2020-09-15 DIAGNOSIS — E782 Mixed hyperlipidemia: Secondary | ICD-10-CM

## 2020-09-15 DIAGNOSIS — I2510 Atherosclerotic heart disease of native coronary artery without angina pectoris: Secondary | ICD-10-CM | POA: Diagnosis not present

## 2020-09-15 NOTE — Patient Instructions (Signed)
Medication Instructions:  Your physician recommends that you continue on your current medications as directed. Please refer to the Current Medication list given to you today.  *If you need a refill on your cardiac medications before your next appointment, please call your pharmacy*   Lab Work: Your physician recommends that you return for lab work in: [place] If you have labs (blood work) drawn today and your tests are completely normal, you will receive your results only by: [place] (if you have MyChart) OR A paper copy in the mail If you have any lab test that is abnormal or we need to change your treatment, we will call you to review the results.   Testing/Procedures: None   Follow-Up: At [facility], you and your health needs are our priority.  As part of our continuing mission to provide you with exceptional heart care, we have created designated Provider Care Teams.  These Care Teams include your primary Cardiologist (physician) and Advanced Practice Providers (APPs -  Physician Assistants and Nurse Practitioners) who all work together to provide you with the care you need, when you need it.  We recommend signing up for the patient portal called "MyChart".  Sign up information is provided on this After Visit Summary.  MyChart is used to connect with patients for Virtual Visits (Telemedicine).  Patients are able to view lab/test results, encounter notes, upcoming appointments, etc.  Non-urgent messages can be sent to your provider as well.   To learn more about what you can do with MyChart, go to [email or web address].    Your next appointment:   1 year(s)  The format for your next appointment:   In Person  Provider:   Shirlee More, MD   Other Instructions

## 2020-09-16 ENCOUNTER — Telehealth: Payer: Self-pay

## 2020-09-16 LAB — LIPID PANEL
Chol/HDL Ratio: 4 ratio (ref 0.0–5.0)
Cholesterol, Total: 101 mg/dL (ref 100–199)
HDL: 25 mg/dL — ABNORMAL LOW (ref >39)
LDL Chol Calc (NIH): 49 mg/dL (ref 0–99)
Triglycerides: 157 mg/dL — ABNORMAL HIGH (ref 0–149)
VLDL Cholesterol Cal: 27 mg/dL (ref 5–40)

## 2020-09-16 NOTE — Telephone Encounter (Signed)
-----   Message from Richardo Priest, MD sent at 09/16/2020  7:45 AM EDT ----- Normal or stable result  No changes

## 2020-09-16 NOTE — Telephone Encounter (Signed)
Spoke with patient regarding results and recommendation.  Patient verbalizes understanding and is agreeable to plan of care. Advised patient to call back with any issues or concerns.  

## 2020-10-10 DIAGNOSIS — M79605 Pain in left leg: Secondary | ICD-10-CM | POA: Diagnosis not present

## 2020-10-10 DIAGNOSIS — Z6832 Body mass index (BMI) 32.0-32.9, adult: Secondary | ICD-10-CM | POA: Diagnosis not present

## 2020-10-11 DIAGNOSIS — S8992XA Unspecified injury of left lower leg, initial encounter: Secondary | ICD-10-CM | POA: Diagnosis not present

## 2020-10-11 DIAGNOSIS — M79662 Pain in left lower leg: Secondary | ICD-10-CM | POA: Diagnosis not present

## 2021-02-16 DIAGNOSIS — L821 Other seborrheic keratosis: Secondary | ICD-10-CM | POA: Diagnosis not present

## 2021-02-16 DIAGNOSIS — L578 Other skin changes due to chronic exposure to nonionizing radiation: Secondary | ICD-10-CM | POA: Diagnosis not present

## 2021-02-16 DIAGNOSIS — L570 Actinic keratosis: Secondary | ICD-10-CM | POA: Diagnosis not present

## 2021-02-16 DIAGNOSIS — L820 Inflamed seborrheic keratosis: Secondary | ICD-10-CM | POA: Diagnosis not present

## 2021-04-23 DIAGNOSIS — Z9181 History of falling: Secondary | ICD-10-CM | POA: Diagnosis not present

## 2021-04-23 DIAGNOSIS — Z1331 Encounter for screening for depression: Secondary | ICD-10-CM | POA: Diagnosis not present

## 2021-04-23 DIAGNOSIS — Z6833 Body mass index (BMI) 33.0-33.9, adult: Secondary | ICD-10-CM | POA: Diagnosis not present

## 2021-04-23 DIAGNOSIS — B3749 Other urogenital candidiasis: Secondary | ICD-10-CM | POA: Diagnosis not present

## 2021-04-23 DIAGNOSIS — L820 Inflamed seborrheic keratosis: Secondary | ICD-10-CM | POA: Diagnosis not present

## 2021-04-23 DIAGNOSIS — Z23 Encounter for immunization: Secondary | ICD-10-CM | POA: Diagnosis not present

## 2021-04-23 DIAGNOSIS — Z139 Encounter for screening, unspecified: Secondary | ICD-10-CM | POA: Diagnosis not present

## 2021-05-20 DIAGNOSIS — N644 Mastodynia: Secondary | ICD-10-CM | POA: Diagnosis not present

## 2021-05-20 DIAGNOSIS — N400 Enlarged prostate without lower urinary tract symptoms: Secondary | ICD-10-CM | POA: Diagnosis not present

## 2021-05-20 DIAGNOSIS — Z6833 Body mass index (BMI) 33.0-33.9, adult: Secondary | ICD-10-CM | POA: Diagnosis not present

## 2021-06-09 DIAGNOSIS — L578 Other skin changes due to chronic exposure to nonionizing radiation: Secondary | ICD-10-CM | POA: Diagnosis not present

## 2021-06-09 DIAGNOSIS — L821 Other seborrheic keratosis: Secondary | ICD-10-CM | POA: Diagnosis not present

## 2021-06-09 DIAGNOSIS — L820 Inflamed seborrheic keratosis: Secondary | ICD-10-CM | POA: Diagnosis not present

## 2021-08-21 DIAGNOSIS — E1165 Type 2 diabetes mellitus with hyperglycemia: Secondary | ICD-10-CM | POA: Diagnosis not present

## 2021-08-21 DIAGNOSIS — N400 Enlarged prostate without lower urinary tract symptoms: Secondary | ICD-10-CM | POA: Diagnosis not present

## 2021-08-21 DIAGNOSIS — N644 Mastodynia: Secondary | ICD-10-CM | POA: Diagnosis not present

## 2021-09-10 DIAGNOSIS — N644 Mastodynia: Secondary | ICD-10-CM | POA: Diagnosis not present

## 2021-09-10 DIAGNOSIS — N62 Hypertrophy of breast: Secondary | ICD-10-CM | POA: Diagnosis not present

## 2021-10-23 NOTE — Progress Notes (Unsigned)
Cardiology Office Note:    Date:  10/26/2021   ID:  Travis Newman, DOB 21-Sep-1945, MRN [id number]  PCP:  Delorise Shiner, MD  Cardiologist:  Shirlee More, MD    Referring MD: Delorise Shiner, MD    ASSESSMENT:    1. Mild CAD   2. High coronary artery calcium score   3. Mixed hyperlipidemia   4. Dysarthria    PLAN:    In order of problems listed above:  Stable CAD and he is having no anginal discomfort he will restart aspirin 81 mg daily continue his high intensity statin and beta-blocker. Continue his statin check lipid profile today Carotid duplex CT of the head for carotid obstructive stenosis and evidence of stroke.   Next appointment: 6 months   Medication Adjustments/Labs and Tests Ordered: Current medicines are reviewed at length with the patient today.  Concerns regarding medicines are outlined above.  Orders Placed This Encounter  Procedures   Comprehensive metabolic panel   Lipid panel   EKG 12-Lead   VAS US CAROTID   Meds ordered this encounter  Medications   aspirin EC 81 MG tablet    Sig: Take 1 tablet (81 mg total) by mouth daily. Swallow whole.    Dispense:  90 tablet    Refill:  3    Chief Complaint  Patient presents with   Follow-up   Coronary Artery Disease   dysarthria    History of Present Illness:    Travis Newman is a 76 y.o. male with a hx of multiple cardiovascular risk including type 2 diabetes and agent orange exposure with a cardiac CTA showing calcium score 377 and minimal nonobstructive CAD last seen 09/15/2020.  Compliance with diet, lifestyle and medications: Yes  He is concerned as he has noticed difficulty with his speech dysarthria even at times trouble finding words wonders if he has had a neurologic event to request evaluation He has had no muscle weakness sensory abnormality or visual loss. 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 Swallow whole.   empagliflozin (JARDIANCE) 25 MG TABS tablet Take 0.5 tablets by mouth daily.   ezetimibe (ZETIA) 10 MG tablet Take 10 mg by mouth daily.   finasteride (PROSCAR) 5 MG tablet Take 5 mg by mouth daily.   gabapentin (NEURONTIN) 300 MG capsule Take 300 mg by mouth 3 (three) times daily.   insulin aspart (  NOVOLOG) 100 UNIT/ML injection Inject 18 Units into the skin 3 (three) times daily before meals.   Insulin Glargine (LANTUS [place]) Inject into the skin at bedtime.   metoprolol tartrate (LOPRESSOR) 25 MG tablet Take 25 mg by mouth daily.   nitroGLYCERIN (NITROSTAT) 0.4 MG SL tablet Place 1 tablet (0.4 mg total) under the tongue every 5 (five) minutes as needed for chest pain.   omeprazole (PRILOSEC) 20 MG capsule Take 20 mg by mouth daily as needed. Acid reflux   rosuvastatin (CRESTOR) 10 MG tablet Take 1 tablet (10 mg total) by mouth daily.   zolpidem (AMBIEN) 10 MG tablet Take 10 mg by mouth at bedtime as needed for sleep.     Allergies:   Penicillins,  Atorvastatin, Lidocaine-epinephrine, Pravastatin, and Clindamycin hcl   Social History   Socioeconomic History   Marital status: Married    Spouse name: Not on file   Number of children: Not on file   Years of education: Not on file   Highest education level: Not on file  Occupational History   Not on file  Tobacco Use   Smoking status: Former    Types: Cigars    Quit date: 79    Years since quitting: 38.7    Passive exposure: Past   Smokeless tobacco: Former  Scientific laboratory technician Use: Never used  Substance and Sexual Activity   Alcohol use: Never   Drug use: Never   Sexual activity: Not Currently  Other Topics Concern   Not on file  Social History Narrative   Not on file   Social Determinants of Health   Financial Resource Strain: Not on file  Food Insecurity: Not on file  Transportation Needs: Not on file  Physical Activity: Not on file  Stress: Not on file  Social Connections: Not on file     Family History: The patient's family history includes Diabetes in his mother; Stroke in his father. There is no history of Colon polyps, Colon cancer, Esophageal cancer, Rectal cancer, or Stomach cancer. ROS:   Please see the history of present illness.    All other systems reviewed and are negative.  EKGs/Labs/Other Studies Reviewed:    The following studies were reviewed today:  EKG:  EKG ordered today and personally reviewed.  The ekg ordered today demonstrates sinus rhythm left axis deviation left anterior hemiblock otherwise normal EKG  Recent Labs: No results found for requested labs within last 365 days.  Recent Lipid Panel    Component Value Date/Time   CHOL 101 09/15/2020 0955   TRIG 157 (H) 09/15/2020 0955   HDL 25 (L) 09/15/2020 0955   CHOLHDL 4.0 09/15/2020 0955   LDLCALC 49 09/15/2020 0955    Physical Exam:    VS:  BP 120/68 (BP Location: Right Arm, Patient Position: Sitting)   Pulse 80   Ht '6\' 2"'$  (1.88 m)   Wt 259 lb 9.6 oz (117.8 kg)   SpO2  94%   BMI 33.33 kg/m     Wt Readings from Last 3 Encounters:  10/26/21 259 lb 9.6 oz (117.8 kg)  09/15/20 258 lb 3.2 oz (117.1 kg)  10/02/19 264 lb (119.7 kg)     GEN:  Well nourished, well developed in no acute distress HEENT: Normal NECK: No JVD; No carotid bruits LYMPHATICS: No lymphadenopathy CARDIAC: RRR, no murmurs, rubs, gallops RESPIRATORY:  Clear to auscultation without rales, wheezing or rhonchi  ABDOMEN: Soft, non-tender, non-distended MUSCULOSKELETAL:  No edema; No deformity  SKIN: Warm  and dry NEUROLOGIC:  Alert and oriented x 3 PSYCHIATRIC:  Normal affect    Signed, Shirlee More, MD  10/26/2021 12:12 PM    [place]

## 2021-10-26 ENCOUNTER — Encounter: Payer: Self-pay | Admitting: Cardiology

## 2021-10-26 ENCOUNTER — Ambulatory Visit: Payer: Medicare Other | Attending: Cardiology | Admitting: Cardiology

## 2021-10-26 VITALS — BP 120/68 | HR 80 | Ht 74.0 in | Wt 259.6 lb

## 2021-10-26 DIAGNOSIS — I2510 Atherosclerotic heart disease of native coronary artery without angina pectoris: Secondary | ICD-10-CM | POA: Diagnosis not present

## 2021-10-26 DIAGNOSIS — E782 Mixed hyperlipidemia: Secondary | ICD-10-CM | POA: Insufficient documentation

## 2021-10-26 DIAGNOSIS — R471 Dysarthria and anarthria: Secondary | ICD-10-CM | POA: Insufficient documentation

## 2021-10-26 DIAGNOSIS — R931 Abnormal findings on diagnostic imaging of heart and coronary circulation: Secondary | ICD-10-CM | POA: Diagnosis not present

## 2021-10-26 MED ORDER — ASPIRIN 81 MG PO TBEC: 81.0000 mg | DELAYED_RELEASE_TABLET | Freq: Every day | ORAL | 3 refills | Status: AC

## 2021-10-26 NOTE — Patient Instructions (Signed)
Medication Instructions:  Your physician has recommended you make the following change in your medication:   Start taking 81 mg coated Aspirin daily.  *If you need a refill on your cardiac medications before your next appointment, please call your pharmacy*   Lab Work: Your physician recommends that you have a CMP and lipids done today in the office.  If you have labs (blood work) drawn today and your tests are completely normal, you will receive your results only by: [place] (if you have MyChart) OR A paper copy in the mail If you have any lab test that is abnormal or we need to change your treatment, we will call you to review the results.   Testing/Procedures: Your physician has requested that you have a carotid duplex. This test is an ultrasound of the carotid arteries in your neck. It looks at blood flow through these arteries that supply the brain with blood. Allow one hour for this exam. There are no restrictions or special instructions.  Non-Cardiac CT scanning, (CAT scanning), is a noninvasive, special x-ray that produces cross-sectional images of the body using x-rays and a computer. CT scans help physicians diagnose and treat medical conditions. For some CT exams, a contrast material is used to enhance visibility in the area of the body being studied. CT scans provide greater clarity and reveal more details than regular x-ray exams. This will be scheduled at [facility].   Follow-Up: At [facility], you and your health needs are our priority.  As part of our continuing mission to provide you with exceptional heart care, we have created designated Provider Care Teams.  These Care Teams include your primary Cardiologist (physician) and Advanced Practice Providers (APPs -  Physician Assistants and Nurse Practitioners) who all work together to provide you with the care you need, when you need it.  We recommend signing up for the patient portal called "MyChart".  Sign  up information is provided on this After Visit Summary.  MyChart is used to connect with patients for Virtual Visits (Telemedicine).  Patients are able to view lab/test results, encounter notes, upcoming appointments, etc.  Non-urgent messages can be sent to your provider as well.   To learn more about what you can do with MyChart, go to [email or web address].    Your next appointment:   12 month(s)  The format for your next appointment:   In Person  Provider:   Shirlee More, MD   Other Instructions NA

## 2021-10-27 LAB — COMPREHENSIVE METABOLIC PANEL
ALT: 39 IU/L (ref 0–44)
AST: 38 IU/L (ref 0–40)
Albumin/Globulin Ratio: 2 (ref 1.2–2.2)
Albumin: 4.5 g/dL (ref 3.8–4.8)
Alkaline Phosphatase: 56 IU/L (ref 44–121)
BUN/Creatinine Ratio: 15 (ref 10–24)
BUN: 14 mg/dL (ref 8–27)
Bilirubin Total: 0.2 mg/dL (ref 0.0–1.2)
CO2: 21 mmol/L (ref 20–29)
Calcium: 9.8 mg/dL (ref 8.6–10.2)
Chloride: 105 mmol/L (ref 96–106)
Creatinine, Ser: 0.94 mg/dL (ref 0.76–1.27)
Globulin, Total: 2.3 g/dL (ref 1.5–4.5)
Glucose: 138 mg/dL — ABNORMAL HIGH (ref 70–99)
Potassium: 4.2 mmol/L (ref 3.5–5.2)
Sodium: 143 mmol/L (ref 134–144)
Total Protein: 6.8 g/dL (ref 6.0–8.5)
eGFR: 86 mL/min/{1.73_m2} (ref >59)

## 2021-10-27 LAB — LIPID PANEL
Chol/HDL Ratio: 4.4 ratio (ref 0.0–5.0)
Cholesterol, Total: 124 mg/dL (ref 100–199)
HDL: 28 mg/dL — ABNORMAL LOW (ref >39)
LDL Chol Calc (NIH): 61 mg/dL (ref 0–99)
Triglycerides: 212 mg/dL — ABNORMAL HIGH (ref 0–149)
VLDL Cholesterol Cal: 35 mg/dL (ref 5–40)

## 2021-11-10 DIAGNOSIS — L578 Other skin changes due to chronic exposure to nonionizing radiation: Secondary | ICD-10-CM | POA: Diagnosis not present

## 2021-11-10 DIAGNOSIS — L821 Other seborrheic keratosis: Secondary | ICD-10-CM | POA: Diagnosis not present

## 2021-11-16 ENCOUNTER — Ambulatory Visit: Payer: Medicare Other | Attending: Cardiology

## 2021-11-16 DIAGNOSIS — R471 Dysarthria and anarthria: Secondary | ICD-10-CM | POA: Diagnosis not present

## 2021-11-16 DIAGNOSIS — I2510 Atherosclerotic heart disease of native coronary artery without angina pectoris: Secondary | ICD-10-CM | POA: Diagnosis not present

## 2021-11-17 ENCOUNTER — Telehealth: Payer: Self-pay

## 2021-11-17 NOTE — Telephone Encounter (Signed)
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  Continue current medications we will need to recheck in about 2 years

## 2021-11-17 NOTE — Telephone Encounter (Signed)
Patient notified of results and recommendations.

## 2021-12-15 DIAGNOSIS — Z23 Encounter for immunization: Secondary | ICD-10-CM | POA: Diagnosis not present

## 2021-12-15 DIAGNOSIS — N400 Enlarged prostate without lower urinary tract symptoms: Secondary | ICD-10-CM | POA: Diagnosis not present

## 2021-12-15 DIAGNOSIS — E1165 Type 2 diabetes mellitus with hyperglycemia: Secondary | ICD-10-CM | POA: Diagnosis not present

## 2021-12-15 DIAGNOSIS — E7800 Pure hypercholesterolemia, unspecified: Secondary | ICD-10-CM | POA: Diagnosis not present

## 2021-12-15 DIAGNOSIS — N62 Hypertrophy of breast: Secondary | ICD-10-CM | POA: Diagnosis not present

## 2022-04-05 DIAGNOSIS — L820 Inflamed seborrheic keratosis: Secondary | ICD-10-CM | POA: Diagnosis not present

## 2022-08-02 DIAGNOSIS — L820 Inflamed seborrheic keratosis: Secondary | ICD-10-CM | POA: Diagnosis not present

## 2022-11-04 DIAGNOSIS — E1165 Type 2 diabetes mellitus with hyperglycemia: Secondary | ICD-10-CM | POA: Diagnosis not present

## 2022-11-04 DIAGNOSIS — L820 Inflamed seborrheic keratosis: Secondary | ICD-10-CM | POA: Diagnosis not present

## 2022-11-04 DIAGNOSIS — Z23 Encounter for immunization: Secondary | ICD-10-CM | POA: Diagnosis not present

## 2022-11-08 NOTE — Progress Notes (Unsigned)
Cardiology Office Note:    Date:  11/08/2022   ID:  Travis Newman, DOB 08/03/1945, MRN [id number]  PCP:  Concha Pyo, MD  Cardiologist:  Norman Herrlich, MD    Referring MD: Concha Pyo, MD    ASSESSMENT:    No diagnosis found. PLAN:    In order of problems listed above:  ***   Next appointment: ***   Medication Adjustments/Labs and Tests Ordered: Current medicines are reviewed at length with the patient today.  Concerns regarding medicines are outlined above.  No orders of the defined types were placed in this encounter.  No orders of the defined types were placed in this encounter.    History of Present Illness:    Travis Newman is a 77 y.o. male with a hx of multiple cardiovascular risk including type 2 diabetes agent orange exposure and a cardiac CTA score of 377 with mild nonobstructive CAD last seen 10/26/2001.  Carotid duplex performed 11/26/2021 with mild 1 to 39% stenosis ICA bilaterally. Compliance with diet, lifestyle and medications: *** Past Medical History:  Diagnosis Date   Arthritis    Basal cell carcinoma 2019   COPD (chronic obstructive pulmonary disease) (HCC)    Diabetes mellitus without complication (HCC) 2010   History of COVID-19 03/09/2019   Hyperlipidemia    Left knee pain 03/21/2014   Primary osteoarthritis of left knee 03/21/2014   Pyogenic granuloma of tongue 02/07/2016   Sleep apnea    uses CPAP nightly   Status post total right knee replacement 03/21/2014   Tachycardia    Tongue lesion 01/15/2016   Type 2 diabetes mellitus without complication, with long-term current use of insulin (HCC) 01/15/2016    Current Medications: No outpatient medications have been marked as taking for the 11/09/22 encounter (Appointment) with Baldo Daub, MD.      EKGs/Labs/Other Studies Reviewed:    The following studies were reviewed today:  Cardiac Studies & Procedures       ECHOCARDIOGRAM  ECHOCARDIOGRAM COMPLETE  04/26/2019  Narrative ECHOCARDIOGRAM REPORT    Patient Name:   Travis Newman Date of Exam: 04/26/2019 Medical Rec #:  [id number]        Height:       74.0 in Accession #:    [id number]       Weight:       273.0 lb Date of Birth:  23-Jun-1945       BSA:          2.481 m Patient Age:    73 years         BP:           110/59 mmHg Patient Gender: M                HR:           79 bpm. 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 Risk Factors:Diabetes.  Sonographer:    Louie Boston Referring Phys: [phone] Travis Newman J Travis Newman  IMPRESSIONS   1. Left ventricular ejection fraction, by estimation, is 55 to 60%. The left ventricle has normal function. The left ventricle has no regional wall motion abnormalities. There is moderate concentric left ventricular hypertrophy. Left ventricular diastolic parameters are consistent with Grade I diastolic dysfunction (impaired relaxation). 2. Right  ventricular systolic function is normal. The right ventricular size is normal. There is normal pulmonary artery systolic pressure. 3. There is a echodense finding on the anterior mitral leaflet - ventricular side which is highly suspected to be focal calcification. Mild mitral valve regurgitation. No evidence of mitral stenosis. 4. The aortic valve is trileaflet with focal calcification on the noncoronary cusp. Aortic valve regurgitation is not visualized. No aortic stenosis is present. 5. The inferior vena cava is normal in size with greater than 50% respiratory variability, suggesting right atrial pressure of 3 mmHg.  FINDINGS Left Ventricle: Left ventricular ejection fraction, by estimation, is 55 to 60%. The left ventricle has normal function. The left ventricle has no regional wall motion  abnormalities. The left ventricular internal cavity size was normal in size. There is moderate concentric left ventricular hypertrophy. Left ventricular diastolic parameters are consistent with Grade I diastolic dysfunction (impaired relaxation).  Right Ventricle: The right ventricular size is normal. No increase in right ventricular wall thickness. Right ventricular systolic function is normal. There is normal pulmonary artery systolic pressure. The tricuspid regurgitant velocity is 2.18 m/s, and with an assumed right atrial pressure of 3 mmHg, the estimated right ventricular systolic pressure is 22.0 mmHg.  Left Atrium: Left atrial size was normal in size.  Right Atrium: Right atrial size was normal in size.  Pericardium: There is no evidence of pericardial effusion.  Mitral Valve: There is a echodense finding on the anterior mitral leaflet - ventricular side. The mitral valve is normal in structure. Normal mobility of the mitral valve leaflets. Mild mitral valve regurgitation. No evidence of mitral valve stenosis.  Tricuspid Valve: The tricuspid valve is normal in structure. Tricuspid valve regurgitation is trivial. No evidence of tricuspid stenosis.  Aortic Valve: The aortic valve is abnormal. . There is mild thickening and mild calcification of the aortic valve. Aortic valve regurgitation is not visualized. No aortic stenosis is present. Mild aortic valve annular calcification. There is mild thickening of the aortic valve. There is mild calcification of the aortic valve.  Pulmonic Valve: The pulmonic valve was not well visualized. Pulmonic valve regurgitation is not visualized. No evidence of pulmonic stenosis.  Aorta: The aortic root is normal in size and structure.  Venous: The inferior vena cava is normal in size with greater than 50% respiratory variability, suggesting right atrial pressure of 3 mmHg.  IAS/Shunts: No atrial level shunt detected by color flow Doppler.   LEFT  VENTRICLE PLAX 2D LVIDd:         4.00 cm Diastology LVIDs:         2.90 cm LV e' lateral:   3.59 cm/s LV PW:         1.38 cm LV E/e' lateral: 19.9 LV IVS:        1.30 cm LV e' medial:    3.05 cm/s LV E/e' medial:  23.4  2D Longitudinal Strain 2D Strain GLS Avg:     -6.4 %  RIGHT VENTRICLE             IVC RV S prime:     16.10 cm/s  IVC diam: 0.90 cm TAPSE (M-mode): 2.2 cm  LEFT ATRIUM             Index       RIGHT ATRIUM           Index LA diam:        3.30 cm 1.33 cm/m  RA Area:     11.10 cm LA  Vol [facility]):   56.8 ml 22.90 ml/m RA Volume:   17.00 ml  6.85 ml/m LA Vol (A4C):   60.0 ml 24.19 ml/m LA Biplane Vol: 59.9 ml 24.15 ml/m AORTIC VALVE LVOT Vmax:   86.50 cm/s LVOT Vmean:  58.500 cm/s LVOT VTI:    0.180 m  AORTA Ao Root diam:  4.10 cm Ao Sinus diam: 4.20 cm Ao STJ diam:   3.7 cm Ao Asc diam:   3.80 cm  MITRAL VALVE                TRICUSPID VALVE MV Area (PHT): 3.85 cm     TR Peak grad:   19.0 mmHg MV Decel Time: 197 msec     TR Vmax:        218.00 cm/s MV E velocity: 71.30 cm/s MV A velocity: 110.00 cm/s  SHUNTS MV E/A ratio:  0.65         Systemic VTI: 0.18 m  Lavona Mound Tobb DO Electronically signed by Thomasene Ripple DO Signature Date/Time: 04/26/2019/3:59:00 PM    Final     CT SCANS  CT CORONARY MORPH W/CTA COR W/SCORE 08/27/2019  Addendum 08/27/2019  5:22 PM ADDENDUM REPORT: 08/27/2019 17:20  HISTORY: 77 yo male with chest pain/anginal equiv, 11yr CHD risk > 20%, not treadmill candidate  EXAM: Cardiac/Coronary CTA  TECHNIQUE: The patient was scanned on a [employer].  PROTOCOL: A 120 kV prospective scan was triggered in the descending thoracic aorta at 111 HU's. Axial non-contrast 3 mm slices were carried out through the heart. The data set was analyzed on a dedicated work station and scored using the Agatson method. Gantry rotation speed was 250 msecs and collimation was .6 mm. Beta blockade and 0.8 mg of sl NTG was given. The 3D  data set was reconstructed in 5% intervals of the 67-82 % of the R-R cycle. Diastolic phases were analyzed on a dedicated work station using MPR, MIP and VRT modes. The patient received 80mL OMNIPAQUE IOHEXOL 350 MG/ML SOLN of contrast.  FINDINGS: Quality: Good, HR 66  Coronary calcium score: The patient's coronary artery calcium score is 337, which places the patient in the 64th percentile.  Coronary arteries: Normal coronary origins.  Right dominance.  Right Coronary Artery: Dominant. Minimal 1-24% proximal to mid vessel punctate mixed stenosis (CADRADS1). No significant mid to distal vessel disease.  Left Main Coronary Artery: Normal. Bifurcates into the LAD and LCx arteries.  Left Anterior Descending Coronary Artery: Minimal mixed 1-24% proximal LAD stenosis (CADRADS1). The mid to distal LAD is tortuous with an intramyocardial bridge of a segment of the distal vessel. The LAD extends around the apex. Large-sized high D1 vessel without disease, bifurcates distally. Moderate sized D2 vessel with minimal 1-24% mixed proximal stenosis (CADRADS1).  Left Circumflex Artery: Large AV groove vessel with minimal 1-24% mixed ostal stenosis (CADRADS1). Moderate OM1 without disease.  Aorta: Normal size, 37 mm at the mid ascending aorta (level of the PA bifurcation) measured double oblique. Aortic atherosclerosis. No dissection.  Aortic Valve: Trileaflet. Valvular calcifications.  Other findings:  Normal pulmonary vein drainage into the left atrium.  Normal left atrial appendage without a thrombus.  Dilated main pulmonary artery to 33 mm, suggestive of pulmonary hypertension.  IMPRESSION: 1. Minimal non-obstructive mixed CAD, CADRADS = 1.  2. Coronary calcium score of 337. This was 64th percentile for age and sex matched control.  3. Normal coronary origin with right dominance.  4. Aortic atherosclerosis.  5. Dilated main pulmonary artery to  33 mm, suggestive of  pulmonary hypertension. The ascending aorta measured normal.  6. Aggressive cardiovascular risk reduction recommended.   Electronically Signed By: Chrystie Nose M.D. On: 08/27/2019 17:20  Narrative EXAM: OVER-READ INTERPRETATION  CT CHEST  The following report is an over-read performed by radiologist Dr. Trudie Reed of [facility] on 08/27/2019. This over-read does not include interpretation of cardiac or coronary anatomy or pathology. The coronary calcium score/coronary CTA interpretation by the cardiologist is attached.  COMPARISON:  None.  FINDINGS: Aortic atherosclerosis. Within the visualized portions of the thorax there are no suspicious appearing pulmonary nodules or masses, there is no acute consolidative airspace disease, no pleural effusions, no pneumothorax and no lymphadenopathy. Visualized portions of the upper abdomen are unremarkable. There are no aggressive appearing lytic or blastic lesions noted in the visualized portions of the skeleton.  IMPRESSION: 1.  Aortic Atherosclerosis (ICD10-I70.0).  Electronically Signed: By: Trudie Reed M.D. On: 08/27/2019 11:57   CT SCANS  CT CARDIAC SCORING (SELF PAY ONLY) 07/02/2019  Addendum 07/02/2019  4:35 PM ADDENDUM REPORT: 07/02/2019 16:33  CLINICAL DATA:  31M for risk stratification  EXAM: Coronary Calcium Score  TECHNIQUE: The patient was scanned on a [employer] scanner. Axial non-contrast 3 mm slices were carried out through the heart. The data set was analyzed on a dedicated work station and scored using the Agatson method.  FINDINGS: Non-cardiac: See separate report from [facility].  Aorta: Calcification noted in the aortic root and descending aorta. Ascending aorta mildly dilated (3.9 cm).  Pericardium: Normal  Coronary arteries:  IMPRESSION: Coronary calcium score of 322. This was 63rd percentile for age and sex matched control.  Calcification noted  in the LAD, LCX and RCA territories.  Mild ascending aorta aneurysm.  Chilton Si, MD   Electronically Signed By: Chilton Si On: 07/02/2019 16:33  Narrative EXAM: OVER-READ INTERPRETATION  CT CHEST  The following report is an over-read performed by radiologist Dr. Jeronimo Greaves of [facility] on 07/02/2019. This over-read does not include interpretation of cardiac or coronary anatomy or pathology. The calcium score interpretation by the cardiologist is attached.  COMPARISON:  08/23/2017 CTA chest  FINDINGS: Vascular: Aortic atherosclerosis.  Mediastinum/Nodes: No imaged thoracic adenopathy.  Lungs/Pleura: Minimal right-sided pleural thickening. Clear imaged lungs.  Upper Abdomen: Moderate hepatic steatosis. Normal imaged portions of the spleen, stomach.  Musculoskeletal: Moderate thoracic spondylosis.  IMPRESSION: 1.  No acute findings in the imaged extracardiac chest. 2.  Aortic Atherosclerosis (ICD10-I70.0). 3. Hepatic steatosis.  Electronically Signed: By: Jeronimo Greaves M.D. On: 07/02/2019 10:32              Recent Labs: No results found for requested labs within last 365 days.  Recent Lipid Panel    Component Value Date/Time   CHOL 124 10/26/2021 1137   TRIG 212 (H) 10/26/2021 1137   HDL 28 (L) 10/26/2021 1137   CHOLHDL 4.4 10/26/2021 1137   LDLCALC 61 10/26/2021 1137    Physical Exam:    VS:  There were no vitals taken for this visit.    Wt Readings from Last 3 Encounters:  10/26/21 259 lb 9.6 oz (117.8 kg)  09/15/20 258 lb 3.2 oz (117.1 kg)  10/02/19 264 lb (119.7 kg)     GEN: *** Well nourished, well developed in no acute distress HEENT: Normal NECK: No JVD; No carotid bruits LYMPHATICS: No lymphadenopathy CARDIAC: ***RRR, no murmurs, rubs, gallops RESPIRATORY:  Clear to auscultation without rales, wheezing or rhonchi  ABDOMEN:  Soft, non-tender, non-distended MUSCULOSKELETAL:  No edema; No deformity  SKIN: Warm  and dry NEUROLOGIC:  Alert and oriented x 3 PSYCHIATRIC:  Normal affect    Signed, Norman Herrlich, MD  11/08/2022 3:40 PM    [facility]

## 2022-11-09 ENCOUNTER — Ambulatory Visit: Payer: Self-pay | Attending: Cardiology | Admitting: Cardiology

## 2022-11-09 ENCOUNTER — Encounter: Payer: Self-pay | Admitting: Cardiology

## 2022-11-09 VITALS — BP 124/60 | HR 76 | Ht 75.0 in | Wt 236.4 lb

## 2022-11-09 DIAGNOSIS — R931 Abnormal findings on diagnostic imaging of heart and coronary circulation: Secondary | ICD-10-CM | POA: Diagnosis not present

## 2022-11-09 DIAGNOSIS — I2510 Atherosclerotic heart disease of native coronary artery without angina pectoris: Secondary | ICD-10-CM | POA: Diagnosis not present

## 2022-11-09 DIAGNOSIS — E782 Mixed hyperlipidemia: Secondary | ICD-10-CM

## 2022-11-09 MED ORDER — NITROGLYCERIN 0.4 MG SL SUBL: 0.4000 mg | SUBLINGUAL_TABLET | SUBLINGUAL | 1 refills | Status: AC | PRN

## 2022-11-09 NOTE — Patient Instructions (Signed)
Medication Instructions:  Your physician recommends that you continue on your current medications as directed. Please refer to the Current Medication list given to you today.  *If you need a refill on your cardiac medications before your next appointment, please call your pharmacy*   Lab Work: Your physician recommends that you return for lab work in:   Labs today: CMP, Lipid  If you have labs (blood work) drawn today and your tests are completely normal, you will receive your results only by: [place] (if you have [place]) OR A paper copy in the mail If you have any lab test that is abnormal or we need to change your treatment, we will call you to review the results.   Testing/Procedures: None   Follow-Up: At [facility], you and your health needs are our priority.  As part of our continuing mission to provide you with exceptional heart care, we have created designated Provider Care Teams.  These Care Teams include your primary Cardiologist (physician) and Advanced Practice Providers (APPs -  Physician Assistants and Nurse Practitioners) who all work together to provide you with the care you need, when you need it.  We recommend signing up for the patient portal called "MyChart".  Sign up information is provided on this After Visit Summary.  MyChart is used to connect with patients for Virtual Visits (Telemedicine).  Patients are able to view lab/test results, encounter notes, upcoming appointments, etc.  Non-urgent messages can be sent to your provider as well.   To learn more about what you can do with MyChart, go to [email or web address].    Your next appointment:   1 year(s)  Provider:   Shirlee More, MD    Other Instructions None

## 2022-11-09 NOTE — Addendum Note (Signed)
Addended by: Roxanne Mins I on: 11/09/2022 08:50 AM   Modules accepted: Orders

## 2022-11-10 LAB — COMPREHENSIVE METABOLIC PANEL
ALT: 37 [IU]/L (ref 0–44)
AST: 38 [IU]/L (ref 0–40)
Albumin: 4.3 g/dL (ref 3.8–4.8)
Alkaline Phosphatase: 57 [IU]/L (ref 44–121)
BUN/Creatinine Ratio: 21 (ref 10–24)
BUN: 17 mg/dL (ref 8–27)
Bilirubin Total: 0.4 mg/dL (ref 0.0–1.2)
CO2: 25 mmol/L (ref 20–29)
Calcium: 9.5 mg/dL (ref 8.6–10.2)
Chloride: 104 mmol/L (ref 96–106)
Creatinine, Ser: 0.82 mg/dL (ref 0.76–1.27)
Globulin, Total: 2.2 g/dL (ref 1.5–4.5)
Glucose: 140 mg/dL — ABNORMAL HIGH (ref 70–99)
Potassium: 4.1 mmol/L (ref 3.5–5.2)
Sodium: 143 mmol/L (ref 134–144)
Total Protein: 6.5 g/dL (ref 6.0–8.5)
eGFR: 92 mL/min/{1.73_m2} (ref >59)

## 2022-11-10 LAB — LIPID PANEL
Chol/HDL Ratio: 4.3 {ratio} (ref 0.0–5.0)
Cholesterol, Total: 117 mg/dL (ref 100–199)
HDL: 27 mg/dL — ABNORMAL LOW (ref >39)
LDL Chol Calc (NIH): 58 mg/dL (ref 0–99)
Triglycerides: 190 mg/dL — ABNORMAL HIGH (ref 0–149)
VLDL Cholesterol Cal: 32 mg/dL (ref 5–40)

## 2023-03-07 DIAGNOSIS — Z0000 Encounter for general adult medical examination without abnormal findings: Secondary | ICD-10-CM | POA: Diagnosis not present

## 2023-03-07 DIAGNOSIS — Z1331 Encounter for screening for depression: Secondary | ICD-10-CM | POA: Diagnosis not present

## 2023-03-07 DIAGNOSIS — Z139 Encounter for screening, unspecified: Secondary | ICD-10-CM | POA: Diagnosis not present

## 2023-03-07 DIAGNOSIS — Z9181 History of falling: Secondary | ICD-10-CM | POA: Diagnosis not present

## 2023-03-22 DIAGNOSIS — M25512 Pain in left shoulder: Secondary | ICD-10-CM | POA: Diagnosis not present

## 2023-03-22 DIAGNOSIS — M19012 Primary osteoarthritis, left shoulder: Secondary | ICD-10-CM | POA: Diagnosis not present

## 2023-04-14 DIAGNOSIS — Z23 Encounter for immunization: Secondary | ICD-10-CM | POA: Diagnosis not present

## 2023-04-14 DIAGNOSIS — E1169 Type 2 diabetes mellitus with other specified complication: Secondary | ICD-10-CM | POA: Diagnosis not present

## 2023-04-14 DIAGNOSIS — B079 Viral wart, unspecified: Secondary | ICD-10-CM | POA: Diagnosis not present

## 2023-04-28 DIAGNOSIS — M25512 Pain in left shoulder: Secondary | ICD-10-CM | POA: Diagnosis not present

## 2023-05-04 DIAGNOSIS — M25512 Pain in left shoulder: Secondary | ICD-10-CM | POA: Diagnosis not present

## 2023-10-26 DIAGNOSIS — E1169 Type 2 diabetes mellitus with other specified complication: Secondary | ICD-10-CM | POA: Diagnosis not present

## 2023-10-26 DIAGNOSIS — B079 Viral wart, unspecified: Secondary | ICD-10-CM | POA: Diagnosis not present

## 2023-11-29 ENCOUNTER — Ambulatory Visit: Payer: Self-pay | Admitting: Cardiology

## 2023-12-22 DIAGNOSIS — S00461A Insect bite (nonvenomous) of right ear, initial encounter: Secondary | ICD-10-CM | POA: Insufficient documentation

## 2023-12-25 NOTE — Progress Notes (Unsigned)
 Cardiology Office Note:    Date:  12/25/2023   ID:  Travis Newman, DOB 03/05/1945, MRN [id number]  PCP:  Salman, Faiza, MD  Cardiologist:  Redell Leiter, MD    Referring MD: Salman, Faiza, MD    ASSESSMENT:    No diagnosis found. PLAN:    In order of problems listed above:  ***   Next appointment: ***   Medication Adjustments/Labs and Tests Ordered: Current medicines are reviewed at length with the patient today.  Concerns regarding medicines are outlined above.  No orders of the defined types were placed in this encounter.  No orders of the defined types were placed in this encounter.    History of Present Illness:    Travis Newman is a 78 y.o. male with a hx of mild nonobstructive CAD with elevated coronary calcium  score 377 mild plaque in the ICA bilaterally and hyper lipidemia.  His most recent lipid profile at that time showed an LDL of 58 non-HDL cholesterol of 90.  Last seen 11/09/2022. Compliance with diet, lifestyle and medications: *** Past Medical History:  Diagnosis Date   Arthritis    Basal cell carcinoma 2019   COPD (chronic obstructive pulmonary disease) (HCC)    Diabetes mellitus without complication (HCC) 2010   History of COVID-19 03/09/2019   Hyperlipidemia    Insect bite (nonvenomous) of right ear, initial encounter (CODE) 12/22/2023   Left knee pain 03/21/2014   Primary osteoarthritis of left knee 03/21/2014   Pyogenic granuloma of tongue 02/07/2016   Sleep apnea    uses CPAP nightly   Status post total right knee replacement 03/21/2014   Tachycardia    Tongue lesion 01/15/2016   Type 2 diabetes mellitus without complication, with long-term current use of insulin  (HCC) 01/15/2016    Current Medications: No outpatient medications have been marked as taking for the 12/26/23 encounter (Appointment) with Leiter Redell PARAS, MD.      EKGs/Labs/Other Studies Reviewed:    The following studies were reviewed today:  Cardiac Studies &  Procedures   ______________________________________________________________________________________________     ECHOCARDIOGRAM  ECHOCARDIOGRAM COMPLETE 04/26/2019  Narrative ECHOCARDIOGRAM REPORT    Patient Name:   Travis Newman Date of Exam: 04/26/2019 Medical Rec #:  [id number]        Height:       74.0 in Accession #:    [id number]       Weight:       273.0 lb Date of Birth:  10/26/1945       BSA:          2.481 m Patient Age:    73 years         BP:           110/59 mmHg Patient Gender: M                HR:           79 bpm. 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 Risk Factors:Diabetes.  Sonographer:    Lynwood Silvas Referring Phys: [phone] Shavonn Convey J Kiki Bivens  IMPRESSIONS   1. Left ventricular ejection fraction, by estimation, is 55 to 60%. The left ventricle has normal function. The left ventricle has no regional wall motion abnormalities. There is moderate concentric left ventricular  hypertrophy. Left ventricular diastolic parameters are consistent with Grade I diastolic dysfunction (impaired relaxation). 2. Right ventricular systolic function is normal. The right ventricular size is normal. There is normal pulmonary artery systolic pressure. 3. There is a echodense finding on the anterior mitral leaflet - ventricular side which is highly suspected to be focal calcification. Mild mitral valve regurgitation. No evidence of mitral stenosis. 4. The aortic valve is trileaflet with focal calcification on the noncoronary cusp. Aortic valve regurgitation is not visualized. No aortic stenosis is present. 5. The inferior vena cava is normal in size with greater than 50% respiratory variability, suggesting right atrial pressure of 3 mmHg.  FINDINGS Left Ventricle:  Left ventricular ejection fraction, by estimation, is 55 to 60%. The left ventricle has normal function. The left ventricle has no regional wall motion abnormalities. The left ventricular internal cavity size was normal in size. There is moderate concentric left ventricular hypertrophy. Left ventricular diastolic parameters are consistent with Grade I diastolic dysfunction (impaired relaxation).  Right Ventricle: The right ventricular size is normal. No increase in right ventricular wall thickness. Right ventricular systolic function is normal. There is normal pulmonary artery systolic pressure. The tricuspid regurgitant velocity is 2.18 m/s, and with an assumed right atrial pressure of 3 mmHg, the estimated right ventricular systolic pressure is 22.0 mmHg.  Left Atrium: Left atrial size was normal in size.  Right Atrium: Right atrial size was normal in size.  Pericardium: There is no evidence of pericardial effusion.  Mitral Valve: There is a echodense finding on the anterior mitral leaflet - ventricular side. The mitral valve is normal in structure. Normal mobility of the mitral valve leaflets. Mild mitral valve regurgitation. No evidence of mitral valve stenosis.  Tricuspid Valve: The tricuspid valve is normal in structure. Tricuspid valve regurgitation is trivial. No evidence of tricuspid stenosis.  Aortic Valve: The aortic valve is abnormal. . There is mild thickening and mild calcification of the aortic valve. Aortic valve regurgitation is not visualized. No aortic stenosis is present. Mild aortic valve annular calcification. There is mild thickening of the aortic valve. There is mild calcification of the aortic valve.  Pulmonic Valve: The pulmonic valve was not well visualized. Pulmonic valve regurgitation is not visualized. No evidence of pulmonic stenosis.  Aorta: The aortic root is normal in size and structure.  Venous: The inferior vena cava is normal in size with greater than 50%  respiratory variability, suggesting right atrial pressure of 3 mmHg.  IAS/Shunts: No atrial level shunt detected by color flow Doppler.   LEFT VENTRICLE PLAX 2D LVIDd:         4.00 cm Diastology LVIDs:         2.90 cm LV e' lateral:   3.59 cm/s LV PW:         1.38 cm LV E/e' lateral: 19.9 LV IVS:        1.30 cm LV e' medial:    3.05 cm/s LV E/e' medial:  23.4  2D Longitudinal Strain 2D Strain GLS Avg:     -6.4 %  RIGHT VENTRICLE             IVC RV S prime:     16.10 cm/s  IVC diam: 0.90 cm TAPSE (M-mode): 2.2 cm  LEFT ATRIUM             Index       RIGHT ATRIUM           Index LA diam:  3.30 cm 1.33 cm/m  RA Area:     11.10 cm LA Vol (A2C):   56.8 ml 22.90 ml/m RA Volume:   17.00 ml  6.85 ml/m LA Vol (A4C):   60.0 ml 24.19 ml/m LA Biplane Vol: 59.9 ml 24.15 ml/m AORTIC VALVE LVOT Vmax:   86.50 cm/s LVOT Vmean:  58.500 cm/s LVOT VTI:    0.180 m  AORTA Ao Root diam:  4.10 cm Ao Sinus diam: 4.20 cm Ao STJ diam:   3.7 cm Ao Asc diam:   3.80 cm  MITRAL VALVE                TRICUSPID VALVE MV Area (PHT): 3.85 cm     TR Peak grad:   19.0 mmHg MV Decel Time: 197 msec     TR Vmax:        218.00 cm/s MV E velocity: 71.30 cm/s MV A velocity: 110.00 cm/s  SHUNTS MV E/A ratio:  0.65         Systemic VTI: 0.18 m  Dub Tobb DO Electronically signed by Dub Huntsman DO Signature Date/Time: 04/26/2019/3:59:00 PM    Final      CT SCANS  CT CORONARY MORPH W/CTA COR W/SCORE 08/27/2019  Addendum 08/27/2019  5:22 PM ADDENDUM REPORT: 08/27/2019 17:20  HISTORY: 78 yo male with chest pain/anginal equiv, 10yr CHD risk > 20%, not treadmill candidate  EXAM: Cardiac/Coronary CTA  TECHNIQUE: The patient was scanned on a [employer].  PROTOCOL: A 120 kV prospective scan was triggered in the descending thoracic aorta at 111 HU's. Axial non-contrast 3 mm slices were carried out through the heart. The data set was analyzed on a dedicated work station and  scored using the Agatson method. Gantry rotation speed was 250 msecs and collimation was .6 mm. Beta blockade and 0.8 mg of sl NTG was given. The 3D data set was reconstructed in 5% intervals of the 67-82 % of the R-R cycle. Diastolic phases were analyzed on a dedicated work station using MPR, MIP and VRT modes. The patient received 80mL OMNIPAQUE  IOHEXOL  350 MG/ML SOLN of contrast.  FINDINGS: Quality: Good, HR 66  Coronary calcium  score: The patient's coronary artery calcium  score is 337, which places the patient in the 64th percentile.  Coronary arteries: Normal coronary origins.  Right dominance.  Right Coronary Artery: Dominant. Minimal 1-24% proximal to mid vessel punctate mixed stenosis (CADRADS1). No significant mid to distal vessel disease.  Left Main Coronary Artery: Normal. Bifurcates into the LAD and LCx arteries.  Left Anterior Descending Coronary Artery: Minimal mixed 1-24% proximal LAD stenosis (CADRADS1). The mid to distal LAD is tortuous with an intramyocardial bridge of a segment of the distal vessel. The LAD extends around the apex. Large-sized high D1 vessel without disease, bifurcates distally. Moderate sized D2 vessel with minimal 1-24% mixed proximal stenosis (CADRADS1).  Left Circumflex Artery: Large AV groove vessel with minimal 1-24% mixed ostal stenosis (CADRADS1). Moderate OM1 without disease.  Aorta: Normal size, 37 mm at the mid ascending aorta (level of the PA bifurcation) measured double oblique. Aortic atherosclerosis. No dissection.  Aortic Valve: Trileaflet. Valvular calcifications.  Other findings:  Normal pulmonary vein drainage into the left atrium.  Normal left atrial appendage without a thrombus.  Dilated main pulmonary artery to 33 mm, suggestive of pulmonary hypertension.  IMPRESSION: 1. Minimal non-obstructive mixed CAD, CADRADS = 1.  2. Coronary calcium  score of 337. This was 64th percentile for age and sex matched  control.  3. Normal  coronary origin with right dominance.  4. Aortic atherosclerosis.  5. Dilated main pulmonary artery to 33 mm, suggestive of pulmonary hypertension. The ascending aorta measured normal.  6. Aggressive cardiovascular risk reduction recommended.   Electronically Signed By: Vinie JAYSON Maxcy M.D. On: 08/27/2019 17:20  Narrative EXAM: OVER-READ INTERPRETATION  CT CHEST  The following report is an over-read performed by radiologist Dr. Toribio Aye of [facility] on 08/27/2019. This over-read does not include interpretation of cardiac or coronary anatomy or pathology. The coronary calcium  score/coronary CTA interpretation by the cardiologist is attached.  COMPARISON:  None.  FINDINGS: Aortic atherosclerosis. Within the visualized portions of the thorax there are no suspicious appearing pulmonary nodules or masses, there is no acute consolidative airspace disease, no pleural effusions, no pneumothorax and no lymphadenopathy. Visualized portions of the upper abdomen are unremarkable. There are no aggressive appearing lytic or blastic lesions noted in the visualized portions of the skeleton.  IMPRESSION: 1.  Aortic Atherosclerosis (ICD10-I70.0).  Electronically Signed: By: Toribio Aye M.D. On: 08/27/2019 11:57   CT SCANS  CT CARDIAC SCORING (SELF PAY ONLY) 07/02/2019  Addendum 07/02/2019  4:35 PM ADDENDUM REPORT: 07/02/2019 16:33  CLINICAL DATA:  31M for risk stratification  EXAM: Coronary Calcium  Score  TECHNIQUE: The patient was scanned on a [employer] scanner. Axial non-contrast 3 mm slices were carried out through the heart. The data set was analyzed on a dedicated work station and scored using the Agatson method.  FINDINGS: Non-cardiac: See separate report from [facility].  Aorta: Calcification noted in the aortic root and descending aorta. Ascending aorta mildly dilated (3.9 cm).  Pericardium:  Normal  Coronary arteries:  IMPRESSION: Coronary calcium  score of 322. This was 63rd percentile for age and sex matched control.  Calcification noted in the LAD, LCX and RCA territories.  Mild ascending aorta aneurysm.  Annabella Scarce, MD   Electronically Signed By: Annabella Scarce On: 07/02/2019 16:33  Narrative EXAM: OVER-READ INTERPRETATION  CT CHEST  The following report is an over-read performed by radiologist Dr. Rockey Kilts of [facility] on 07/02/2019. This over-read does not include interpretation of cardiac or coronary anatomy or pathology. The calcium  score interpretation by the cardiologist is attached.  COMPARISON:  08/23/2017 CTA chest  FINDINGS: Vascular: Aortic atherosclerosis.  Mediastinum/Nodes: No imaged thoracic adenopathy.  Lungs/Pleura: Minimal right-sided pleural thickening. Clear imaged lungs.  Upper Abdomen: Moderate hepatic steatosis. Normal imaged portions of the spleen, stomach.  Musculoskeletal: Moderate thoracic spondylosis.  IMPRESSION: 1.  No acute findings in the imaged extracardiac chest. 2.  Aortic Atherosclerosis (ICD10-I70.0). 3. Hepatic steatosis.  Electronically Signed: By: Rockey Kilts M.D. On: 07/02/2019 10:32     ______________________________________________________________________________________________          Recent Labs: No results found for requested labs within last 365 days.  Recent Lipid Panel    Component Value Date/Time   CHOL 117 11/09/2022 0851   TRIG 190 (H) 11/09/2022 0851   HDL 27 (L) 11/09/2022 0851   CHOLHDL 4.3 11/09/2022 0851   LDLCALC 58 11/09/2022 0851    Physical Exam:    VS:  There were no vitals taken for this visit.    Wt Readings from Last 3 Encounters:  11/09/22 236 lb 6.4 oz (107.2 kg)  10/26/21 259 lb 9.6 oz (117.8 kg)  09/15/20 258 lb 3.2 oz (117.1 kg)     GEN: *** Well nourished, well developed in no acute distress HEENT: Normal NECK: No JVD;  No carotid bruits LYMPHATICS: No  lymphadenopathy CARDIAC: ***RRR, no murmurs, rubs, gallops RESPIRATORY:  Clear to auscultation without rales, wheezing or rhonchi  ABDOMEN: Soft, non-tender, non-distended MUSCULOSKELETAL:  No edema; No deformity  SKIN: Warm and dry NEUROLOGIC:  Alert and oriented x 3 PSYCHIATRIC:  Normal affect    Signed, Redell Leiter, MD  12/25/2023 8:56 AM    [facility]

## 2023-12-26 ENCOUNTER — Encounter: Payer: Self-pay | Admitting: Cardiology

## 2023-12-26 ENCOUNTER — Ambulatory Visit: Payer: Self-pay | Attending: Cardiology | Admitting: Cardiology

## 2023-12-26 VITALS — BP 112/64 | HR 82 | Ht 75.0 in | Wt 261.6 lb

## 2023-12-26 DIAGNOSIS — I2510 Atherosclerotic heart disease of native coronary artery without angina pectoris: Secondary | ICD-10-CM | POA: Diagnosis not present

## 2023-12-26 DIAGNOSIS — Z79899 Other long term (current) drug therapy: Secondary | ICD-10-CM | POA: Diagnosis not present

## 2023-12-26 DIAGNOSIS — E782 Mixed hyperlipidemia: Secondary | ICD-10-CM | POA: Diagnosis not present

## 2023-12-26 NOTE — Patient Instructions (Signed)
 Medication Instructions:  Your physician recommends that you continue on your current medications as directed. Please refer to the Current Medication list given to you today.  *If you need a refill on your cardiac medications before your next appointment, please call your pharmacy*  Lab Work: Your physician recommends that you return for lab work in: Today for CMP, Fasting Lipid Panel and ApoB  If you have labs (blood work) drawn today and your tests are completely normal, you will receive your results only by: MyChart Message (if you have MyChart) OR A paper copy in the mail If you have any lab test that is abnormal or we need to change your treatment, we will call you to review the results.  Testing/Procedures: NONE  Follow-Up: At [facility], you and your health needs are our priority.  As part of our continuing mission to provide you with exceptional heart care, our providers are all part of one team.  This team includes your primary Cardiologist (physician) and Advanced Practice Providers or APPs (Physician Assistants and Nurse Practitioners) who all work together to provide you with the care you need, when you need it.  Your next appointment:   1 year(s)  Provider:   Redell Leiter, MD    We recommend signing up for the patient portal called MyChart.  Sign up information is provided on this After Visit Summary.  MyChart is used to connect with patients for Virtual Visits (Telemedicine).  Patients are able to view lab/test results, encounter notes, upcoming appointments, etc.  Non-urgent messages can be sent to your provider as well.   To learn more about what you can do with MyChart, go to [email or web address].   Other Instructions

## 2023-12-27 ENCOUNTER — Ambulatory Visit: Payer: Self-pay | Admitting: Cardiology

## 2023-12-27 ENCOUNTER — Other Ambulatory Visit: Payer: Self-pay

## 2023-12-27 DIAGNOSIS — E782 Mixed hyperlipidemia: Secondary | ICD-10-CM

## 2023-12-27 LAB — COMPREHENSIVE METABOLIC PANEL WITH GFR
ALT: 40 IU/L (ref 0–44)
AST: 57 IU/L — ABNORMAL HIGH (ref 0–40)
Albumin: 4.7 g/dL (ref 3.8–4.8)
Alkaline Phosphatase: 51 IU/L (ref 47–123)
BUN/Creatinine Ratio: 20 (ref 10–24)
BUN: 17 mg/dL (ref 8–27)
Bilirubin Total: 0.3 mg/dL (ref 0.0–1.2)
CO2: 21 mmol/L (ref 20–29)
Calcium: 9.8 mg/dL (ref 8.6–10.2)
Chloride: 101 mmol/L (ref 96–106)
Creatinine, Ser: 0.84 mg/dL (ref 0.76–1.27)
Globulin, Total: 2.7 g/dL (ref 1.5–4.5)
Glucose: 127 mg/dL — ABNORMAL HIGH (ref 70–99)
Potassium: 4 mmol/L (ref 3.5–5.2)
Sodium: 139 mmol/L (ref 134–144)
Total Protein: 7.4 g/dL (ref 6.0–8.5)
eGFR: 90 mL/min/1.73 (ref >59)

## 2023-12-27 LAB — LIPID PANEL+APOB
Apolipoprotein B: 110 mg/dL — ABNORMAL HIGH (ref <90)
Cholesterol, Total: 193 mg/dL (ref 100–199)
HDL-C: 30 mg/dL — ABNORMAL LOW (ref >39)
LDL-C (NIH Calc): 80 mg/dL (ref 0–99)
Non-HDL Cholesterol: 163 mg/dL — ABNORMAL HIGH (ref 0–129)
Triglycerides: 521 mg/dL — ABNORMAL HIGH (ref 0–149)

## 2023-12-27 MED ORDER — ICOSAPENT ETHYL 1 G PO CAPS: 2.0000 g | ORAL_CAPSULE | Freq: Two times a day (BID) | ORAL | 3 refills | Status: AC

## 2023-12-28 ENCOUNTER — Other Ambulatory Visit (HOSPITAL_COMMUNITY): Payer: Self-pay

## 2023-12-28 ENCOUNTER — Telehealth: Payer: Self-pay | Admitting: Cardiology

## 2023-12-28 ENCOUNTER — Telehealth: Payer: Self-pay | Admitting: Pharmacy Technician

## 2023-12-28 NOTE — Telephone Encounter (Signed)
 Please do a PA for Vascepa .

## 2023-12-28 NOTE — Telephone Encounter (Signed)
   ICOSAPENT FILLED 12/27/23   607.00 on generic for 90 days on insurance  Patient Advocate Encounter   The patient was approved for a [employer] grant that will help cover the cost of vascepa Total amount awarded, 2500.  Effective: 11/28/23 - 11/26/24   APW:[id number] ERW:[id number] Hmnle:[id number] PI:[id number] [employer] ID: [id number]   Pharmacy provided with approval and processing information. Patient informed via telephone   He doesn't use mychart.

## 2023-12-28 NOTE — Telephone Encounter (Signed)
 Pt spouse came in to let us  know they need a call from nurse to go over medication and how they can not afford the medication prescribed. Pt spouse said it was a $600 copayment for the new medication [facility] put them on and they needed an alternative.

## 2024-03-09 NOTE — Telephone Encounter (Signed)
 Error

## 2024-03-15 ENCOUNTER — Other Ambulatory Visit: Payer: Self-pay

## 2024-03-15 DIAGNOSIS — E782 Mixed hyperlipidemia: Secondary | ICD-10-CM

## 2024-03-16 ENCOUNTER — Encounter: Payer: Self-pay | Admitting: Cardiology

## 2024-03-16 LAB — LIPID PANEL
Chol/HDL Ratio: 4.4 ratio (ref 0.0–5.0)
Cholesterol, Total: 122 mg/dL (ref 100–199)
HDL: 28 mg/dL — ABNORMAL LOW
LDL Chol Calc (NIH): 44 mg/dL (ref 0–99)
Triglycerides: 328 mg/dL — ABNORMAL HIGH (ref 0–149)
VLDL Cholesterol Cal: 50 mg/dL — ABNORMAL HIGH (ref 5–40)

## 2024-03-16 LAB — APOLIPOPROTEIN B: Apolipoprotein B: 74 mg/dL
